# Patient Record
Sex: Male | Born: 1970
Health system: Southern US, Community
[De-identification: ages and names within clinical notes are randomized; demographics above are authoritative.]

## PROBLEM LIST (undated history)

## (undated) DIAGNOSIS — E119 Type 2 diabetes mellitus without complications: Secondary | ICD-10-CM

## (undated) HISTORY — PX: WRIST SURGERY: SHX841

## (undated) HISTORY — DX: Type 2 diabetes mellitus without complications: E11.9

---

## 2019-03-29 ENCOUNTER — Encounter: Payer: Self-pay | Admitting: Infectious Diseases

## 2019-04-26 ENCOUNTER — Other Ambulatory Visit (HOSPITAL_COMMUNITY)
Admission: RE | Admit: 2019-04-26 | Discharge: 2019-04-26 | Disposition: A | Discharge status: Home / Self Care | Payer: 59 | Source: Ambulatory Visit | Attending: Infectious Diseases | Admitting: Infectious Diseases

## 2019-04-26 ENCOUNTER — Other Ambulatory Visit: Payer: Self-pay

## 2019-04-26 ENCOUNTER — Ambulatory Visit: Payer: 59

## 2019-04-26 ENCOUNTER — Other Ambulatory Visit: Payer: 59

## 2019-04-26 DIAGNOSIS — B20 Human immunodeficiency virus [HIV] disease: Secondary | ICD-10-CM | POA: Insufficient documentation

## 2019-04-26 LAB — URINALYSIS
Bilirubin Urine: NEGATIVE
Hgb urine dipstick: NEGATIVE
Leukocytes,Ua: NEGATIVE
Nitrite: NEGATIVE
Specific Gravity, Urine: 1.034 (ref 1.001–1.03)
pH: <=5 (ref 5.0–8.0)

## 2019-04-27 LAB — URINE CYTOLOGY ANCILLARY ONLY
Chlamydia: NEGATIVE
Comment: NEGATIVE
Comment: NORMAL
Neisseria Gonorrhea: NEGATIVE

## 2019-04-27 LAB — T-HELPER CELL (CD4) - (RCID CLINIC ONLY)
CD4 % Helper T Cell: 5 % — ABNORMAL LOW (ref 33–65)
CD4 T Cell Abs: <35 /uL — ABNORMAL LOW (ref 400–1790)

## 2019-05-06 LAB — COMPLETE METABOLIC PANEL WITH GFR
AG Ratio: 1.2 (calc) (ref 1.0–2.5)
ALT: 21 U/L (ref 9–46)
AST: 19 U/L (ref 10–40)
Albumin: 4 g/dL (ref 3.6–5.1)
Alkaline phosphatase (APISO): 99 U/L (ref 36–130)
BUN: 11 mg/dL (ref 7–25)
CO2: 30 mmol/L (ref 20–32)
Calcium: 9.2 mg/dL (ref 8.6–10.3)
Chloride: 95 mmol/L — ABNORMAL LOW (ref 98–110)
Creat: 0.98 mg/dL (ref 0.60–1.35)
GFR, Est African American: 105 mL/min/{1.73_m2} (ref >=60)
GFR, Est Non African American: 91 mL/min/{1.73_m2} (ref >=60)
Globulin: 3.4 g/dL (calc) (ref 1.9–3.7)
Glucose, Bld: 333 mg/dL — ABNORMAL HIGH (ref 65–99)
Potassium: 4.4 mmol/L (ref 3.5–5.3)
Sodium: 134 mmol/L — ABNORMAL LOW (ref 135–146)
Total Bilirubin: 0.7 mg/dL (ref 0.2–1.2)
Total Protein: 7.4 g/dL (ref 6.1–8.1)

## 2019-05-06 LAB — HIV-1 RNA ULTRAQUANT REFLEX TO GENTYP+
HIV 1 RNA Quant: 341000 copies/mL — ABNORMAL HIGH
HIV-1 RNA Quant, Log: 5.53 Log copies/mL — ABNORMAL HIGH

## 2019-05-06 LAB — CBC WITH DIFFERENTIAL/PLATELET
Absolute Monocytes: 601 cells/uL (ref 200–950)
Basophils Absolute: 20 cells/uL (ref 0–200)
Basophils Relative: 0.6 %
Eosinophils Absolute: 119 cells/uL (ref 15–500)
Eosinophils Relative: 3.6 %
HCT: 41.7 % (ref 38.5–50.0)
Hemoglobin: 14.5 g/dL (ref 13.2–17.1)
Lymphs Abs: 281 cells/uL — ABNORMAL LOW (ref 850–3900)
MCH: 31.4 pg (ref 27.0–33.0)
MCHC: 34.8 g/dL (ref 32.0–36.0)
MCV: 90.3 fL (ref 80.0–100.0)
MPV: 10.4 fL (ref 7.5–12.5)
Monocytes Relative: 18.2 %
Neutro Abs: 2280 cells/uL (ref 1500–7800)
Neutrophils Relative %: 69.1 %
Platelets: 207 10*3/uL (ref 140–400)
RBC: 4.62 10*6/uL (ref 4.20–5.80)
RDW: 12 % (ref 11.0–15.0)
Total Lymphocyte: 8.5 %
WBC: 3.3 10*3/uL — ABNORMAL LOW (ref 3.8–10.8)

## 2019-05-06 LAB — HIV-1 GENOTYPE: HIV-1 Genotype: DETECTED — AB

## 2019-05-06 LAB — HEPATITIS C ANTIBODY
Hepatitis C Ab: NONREACTIVE
SIGNAL TO CUT-OFF: 0.04 (ref <1.00)

## 2019-05-06 LAB — HEPATITIS B CORE ANTIBODY, TOTAL: Hep B Core Total Ab: NONREACTIVE

## 2019-05-06 LAB — LIPID PANEL
Cholesterol: 183 mg/dL (ref <200)
HDL: 30 mg/dL — ABNORMAL LOW (ref >=40)
LDL Cholesterol (Calc): 128 mg/dL (calc) — ABNORMAL HIGH
Non-HDL Cholesterol (Calc): 153 mg/dL (calc) — ABNORMAL HIGH (ref <130)
Total CHOL/HDL Ratio: 6.1 (calc) — ABNORMAL HIGH (ref <5.0)
Triglycerides: 140 mg/dL (ref <150)

## 2019-05-06 LAB — QUANTIFERON-TB GOLD PLUS
Mitogen-NIL: 1.02 IU/mL
NIL: 0.04 IU/mL
QuantiFERON-TB Gold Plus: NEGATIVE
TB1-NIL: 0 IU/mL
TB2-NIL: 0 IU/mL

## 2019-05-06 LAB — HIV-1/2 AB - DIFFERENTIATION
HIV-1 antibody: POSITIVE — AB
HIV-2 Ab: NEGATIVE

## 2019-05-06 LAB — HIV ANTIBODY (ROUTINE TESTING W REFLEX): HIV 1&2 Ab, 4th Generation: REACTIVE — AB

## 2019-05-06 LAB — HLA B*5701: HLA-B*5701 w/rflx HLA-B High: NEGATIVE

## 2019-05-06 LAB — HEPATITIS B SURFACE ANTIBODY,QUALITATIVE: Hep B S Ab: NONREACTIVE

## 2019-05-06 LAB — HEPATITIS B SURFACE ANTIGEN: Hepatitis B Surface Ag: NONREACTIVE

## 2019-05-06 LAB — RPR: RPR Ser Ql: NONREACTIVE

## 2019-05-06 LAB — HEPATITIS A ANTIBODY, TOTAL: Hepatitis A AB,Total: REACTIVE — AB

## 2019-05-16 ENCOUNTER — Ambulatory Visit (INDEPENDENT_AMBULATORY_CARE_PROVIDER_SITE_OTHER): Payer: 59 | Admitting: Infectious Diseases

## 2019-05-16 ENCOUNTER — Telehealth: Payer: Self-pay | Admitting: Pharmacy Technician

## 2019-05-16 ENCOUNTER — Other Ambulatory Visit: Payer: Self-pay

## 2019-05-16 ENCOUNTER — Encounter: Payer: Self-pay | Admitting: Infectious Diseases

## 2019-05-16 ENCOUNTER — Ambulatory Visit (INDEPENDENT_AMBULATORY_CARE_PROVIDER_SITE_OTHER): Payer: 59 | Admitting: Pharmacist

## 2019-05-16 VITALS — BP 118/83 | HR 87 | Temp 98.9°F | Ht 62.0 in | Wt 114.0 lb

## 2019-05-16 DIAGNOSIS — Z23 Encounter for immunization: Secondary | ICD-10-CM | POA: Diagnosis not present

## 2019-05-16 DIAGNOSIS — B20 Human immunodeficiency virus [HIV] disease: Secondary | ICD-10-CM | POA: Diagnosis not present

## 2019-05-16 DIAGNOSIS — Z21 Asymptomatic human immunodeficiency virus [HIV] infection status: Secondary | ICD-10-CM | POA: Insufficient documentation

## 2019-05-16 DIAGNOSIS — E119 Type 2 diabetes mellitus without complications: Secondary | ICD-10-CM | POA: Insufficient documentation

## 2019-05-16 HISTORY — DX: Human immunodeficiency virus (HIV) disease: B20

## 2019-05-16 MED ORDER — ONDANSETRON 4 MG PO TBDP: 4.0000 mg | ORAL_TABLET | Freq: Three times a day (TID) | ORAL | 0 refills | Status: DC | PRN

## 2019-05-16 MED ORDER — BICTEGRAVIR-EMTRICITAB-TENOFOV 50-200-25 MG PO TABS: 1.0000 | ORAL_TABLET | Freq: Every day | ORAL | 5 refills | Status: DC

## 2019-05-16 MED ORDER — FLUCONAZOLE 200 MG PO TABS: 200.0000 mg | ORAL_TABLET | Freq: Every day | ORAL | 0 refills | Status: DC

## 2019-05-16 MED ORDER — SULFAMETHOXAZOLE-TRIMETHOPRIM 200-40 MG/5ML PO SUSP: 10.0000 mL | Freq: Every day | ORAL | 2 refills | Status: DC

## 2019-05-16 MED FILL — ONDANSETRON ODT 4 MG TABLET: 4 | 6 days supply | Qty: 20 | Fill #0

## 2019-05-16 MED FILL — SULFAMETHOXAZOLE-TMP SUSP: 200-40 | 20 days supply | Qty: 200 | Fill #0

## 2019-05-16 MED FILL — BIKTARVY 50-200-25 MG TABS: 50-200-25 | 30 days supply | Qty: 30 | Fill #0

## 2019-05-16 MED FILL — FLUCONAZOLE 200 MG TAB: 200 | 21 days supply | Qty: 21 | Fill #0

## 2019-05-16 NOTE — Patient Instructions (Addendum)
If you cannot swallow your pills - please call to let us know so we can try to get you some different mouth rinse for the pain.   New Medications:  [name] is the pill I would like for you to start taking to treat you - this will need to be taken once a day around the same time.  - Common side effects for a short time frame usually include headaches, nausea and diarrhea - OK to take over the counter tylenol for headaches and imodium for diarrhea - Try taking with food if you are nauseated  - If you take any multivitamins or supplements please separate them from your Biktarvy by 6 hours before and after.  The main thing is do not have them in the stomach at the same time.  Fluconazole - small pink pill once a day for your mouth. Please continue this for 21 days.   Bactrim (liquid) - please take 10 mLs once a day - this will protect you while your condition improves.   Zofran dissolvable tablet - place one under the tongue to dissolve if you have trouble with nausea or vomiting with your medications. Take 30 minutes before your Biktarvy.   Metformin - will need to decrease your dose to ONE 500 mg tablet twice a day.    Please come back in 1 month with Judeth Cornfield to check in

## 2019-05-16 NOTE — Telephone Encounter (Signed)
[facility] Patient Advocate Encounter    Findings of the benefits investigation:   Insurance: Manufacturing engineer) Test run [employer]) Estimated copay amount: $899.02 Prior Authorization: not required  It does not appear to be deductible but rather a 20% coverage medication. Will need [employer]ile[employer] copay card and later in the year PAF to cover.  Beulah Gandy, CPhT Specialty Pharmacy Patient [facility] for Infectious Disease Phone: [phone] Fax: [phone] 05/16/2019 9:37 AM

## 2019-05-16 NOTE — Progress Notes (Signed)
HPI: Robert Rosario is a 49 y.o. male who presents to the [facility] clinic today to initiate care with NP Dixon for his newly diagnosed HIV infection.  Patient Active Problem List   Diagnosis Date Noted  . Type 2 diabetes mellitus without complications ([place]) 98/92/1194  . HIV (human immunodeficiency virus infection) ([place]) 05/16/2019  . AIDS (acquired immune deficiency syndrome) ([place]) 05/16/2019    Patient's Medications  New Prescriptions   No medications on file  Previous Medications   ASPIRIN 81 MG CHEWABLE TABLET    Chew by mouth daily.   BICTEGRAVIR-EMTRICITABINE-TENOFOVIR AF (BIKTARVY) 50-200-25 MG TABS TABLET    Take 1 tablet by mouth daily. Try to take at the same time each day with or without food.   FLUCONAZOLE (DIFLUCAN) 200 MG TABLET    Take 1 tablet (200 mg total) by mouth daily.   GLIPIZIDE (GLUCOTROL XL) 10 MG 24 HR TABLET    Take 10 mg by mouth daily.   METFORMIN (GLUCOPHAGE-XR) 500 MG 24 HR TABLET    Take 500 mg by mouth 2 (two) times daily.   ONDANSETRON (ZOFRAN ODT) 4 MG DISINTEGRATING TABLET    Take 1 tablet (4 mg total) by mouth every 8 (eight) hours as needed for nausea or vomiting.   SULFAMETHOXAZOLE-TRIMETHOPRIM (BACTRIM) 200-40 MG/5ML SUSPENSION    Take 10 mLs by mouth daily.  Modified Medications   No medications on file  Discontinued Medications   No medications on file    Allergies: No Known Allergies  Past Medical History: Past Medical History:  Diagnosis Date  . Diabetes mellitus without complication [facility])     Social History: Social History   Socioeconomic History  . Marital status: Widowed    Spouse name: Not on file  . Number of children: Not on file  . Years of education: Not on file  . Highest education level: Not on file  Occupational History  . Not on file  Tobacco Use  . Smoking status: Never Smoker  . Smokeless tobacco: Never Used  Substance and Sexual Activity  . Alcohol use: Yes    Alcohol/week: 1.0 standard drinks    Types: 1  Glasses of wine per week  . Drug use: Never  . Sexual activity: Yes    Partners: Female  Other Topics Concern  . Not on file  Social History Narrative   Leaving with girlfriend   Drink 1 can beer a night.   Social Determinants of Health   Financial Resource Strain:   . Difficulty of Paying Living Expenses:   Food Insecurity:   . Worried About Charity fundraiser in the Last Year:   . Arboriculturist in the Last Year:   Transportation Needs:   . Film/video editor (Medical):   Marland Kitchen Lack of Transportation (Non-Medical):   Physical Activity:   . Days of Exercise per Week:   . Minutes of Exercise per Session:   Stress:   . Feeling of Stress :   Social Connections:   . Frequency of Communication with Friends and Family:   . Frequency of Social Gatherings with Friends and Family:   . Attends Religious Services:   . Active Member of Clubs or Organizations:   . Attends Archivist Meetings:   Marland Kitchen Marital Status:     Labs: Lab Results  Component Value Date   HIV1RNAQUANT 341,000 (H) 04/26/2019   CD4TABS <35 (L) 04/26/2019    RPR and STI Lab Results  Component Value Date  LABRPR NON-REACTIVE 04/26/2019    STI Results GC CT  04/26/2019 Negative Negative    Hepatitis B Lab Results  Component Value Date   HEPBSAB NON-REACTIVE 04/26/2019   HEPBSAG NON-REACTIVE 04/26/2019   HEPBCAB NON-REACTIVE 04/26/2019   Hepatitis C Lab Results  Component Value Date   HEPCAB NON-REACTIVE 04/26/2019   Hepatitis A Lab Results  Component Value Date   HAV REACTIVE (A) 04/26/2019   Lipids: Lab Results  Component Value Date   CHOL 183 04/26/2019   TRIG 140 04/26/2019   HDL 30 (L) 04/26/2019   CHOLHDL 6.1 (H) 04/26/2019   LDLCALC 128 (H) 04/26/2019    Current HIV Regimen: Treatment naive  Assessment: JP is here today to initiate care with [facility] for his newly diagnosed HIV infection. JP is treatment naive with an initial HIV viral load of 341,000 and a CD4  count of <35. Will start patient on Biktarvy.  Resistance to Efavirenz & Nevirapine. (K103R, V179D)  Patient was counseled on administration/ADE of Biktarvy. Emphasis was placed on him taking the medication everyday.   Plan: 1) Start Biktarvy 2) Start Fluconazole for 21 days for severe oral thrush 3) Start liquid Bactrim for PJP until CD4 count >200 for at least 3 consecutive months. 4) F/U 1 month with Atha Starks  Student Pharmacist, Class of [phone] for Infectious Disease 05/16/2019, 10:03 AM

## 2019-05-16 NOTE — Assessment & Plan Note (Signed)
Continue taking Glipizide 10mg  tablet daily. Decreased Metformin to one 500mg  tablet twice a day and will monitor for signs increase Metformin concentrations (lactic acidosis) while on Biktarvy.

## 2019-05-16 NOTE — Telephone Encounter (Signed)
[facility] Patient Advocate Encounter   Was successful in obtaining a [employer]ile[employer] copay card. This copay card will make the patients copay $0.  The billing information is RxBin: [id number] PCN: [id number] Member ID: [id number] Group ID: [id number]    Beulah Gandy, CPhT Specialty Pharmacy Patient [facility] for Infectious Disease Phone: [phone] Fax: [phone] 05/16/2019 9:43 AM

## 2019-05-16 NOTE — Progress Notes (Addendum)
Subjective:    Patient ID: Robert Rosario is a 49 y.o. male.  Chief Complaint: Robert Rosario is a new patient being referred by his PCP for a positive HIV-1 test on 1/27/2, this was his first time being tested for HIV. He is entering care at stage 3 HIV with AIDS defining symptoms. Viral load on 04/26/19 was 341,000 copies and CD4 nadir <35. Patient reports having recurrent oral thrush since December, his PCP started him on nystatin switch and oral Fluconazole. He has not been able to take his oral medications or eat. He has been taking Boost and Pedialyte from time to time to help with nutrition. He has lost significant amount of weight (>50lbs) over the past several months. He does not remember any viral syndrome symptoms( fevers, body aches, fatigue, diarrhea,rash). He declines feeling down or depressed. He is a former tobacco smoker. He drinks 1 beer after work everyday. He denies illicit drug use. He denies being currently sexually active.  Risk factors: Heterosexual contact [id number] negative Reactive for Hep A. Hep B and Hep C negative  Data Review:   Review of Systems  Constitutional: Positive for appetite change and unexpected weight change (loss more than 50lbs over past several months). Negative for activity change, chills, diaphoresis, fatigue and fever.  HENT: Positive for mouth sores and trouble swallowing.   Eyes: Negative.   Respiratory: Negative for cough, shortness of breath and wheezing.   Cardiovascular: Negative for chest pain, palpitations and leg swelling.  Gastrointestinal: Positive for vomiting (after he eats or take his medications). Negative for diarrhea and nausea.  Endocrine: Negative for cold intolerance and heat intolerance.  Skin: Negative for rash.  Neurological: Negative for dizziness, light-headedness, numbness and headaches.  Psychiatric/Behavioral: Negative for sleep disturbance. The patient is not nervous/anxious.       Objective:  Physical Exam Vitals  reviewed.  Constitutional:      Appearance: He is ill-appearing.  HENT:     Head: Normocephalic.     Nose: Nose normal.     Mouth/Throat:     Comments: Diffuse white patches in gum area, cheeks, tongue and throat. Eyes:     Extraocular Movements: Extraocular movements intact.     Pupils: Pupils are equal, round, and reactive to light.  Cardiovascular:     Rate and Rhythm: Normal rate and regular rhythm.     Pulses: Normal pulses.     Heart sounds: Normal heart sounds.  Pulmonary:     Effort: Pulmonary effort is normal.     Breath sounds: Normal breath sounds.  Abdominal:     General: Abdomen is flat. Bowel sounds are normal.  Musculoskeletal:        General: Normal range of motion.     Cervical back: Normal range of motion.  Skin:    General: Skin is warm and dry.  Neurological:     Mental Status: He is alert and oriented to person, place, and time.  Psychiatric:        Mood and Affect: Mood normal.        Behavior: Behavior normal.        Thought Content: Thought content normal.        Judgment: Judgment normal.    Today's Vitals   05/16/19 0913  BP: 118/83  Pulse: 87  Temp: 98.9 F (37.2 C)  SpO2: 99%  Weight: 114 lb (51.7 kg)  Height: 5' 2"  (1.575 m)   Body mass index is 20.85 kg/m.   Lab Results  Component  Value Date   HIV1RNAQUANT 341,000 (H) 04/26/2019    Assessment:   Problem List Items Addressed This Visit      Endocrine   Type 2 diabetes mellitus without complications ([place])    Continue taking Glipizide 83m tablet daily. Decreased Metformin to one 5078mtablet twice a day and will monitor for signs increase Metformin concentrations (lactic acidosis) while on Biktarvy.       Relevant Medications   glipiZIDE (GLUCOTROL XL) 10 MG 24 hr tablet   metFORMIN (GLUCOPHAGE-XR) 500 MG 24 hr tablet   aspirin 81 MG chewable tablet     Other   HIV (human immunodeficiency virus infection) ([place]   Discussed pathophysiology, risk factors, diagnosis and  treatment of HIV. Start taking Biktarvy one tablet daily. Discussed resources available at the clinic including mental health counseling. Met with pharmacist and pharmacy tech. Return to clinic in 1 month for follow up and lab work(HIV viral load and CD4 count).       Relevant Medications   fluconazole (DIFLUCAN) 200 MG tablet   sulfamethoxazole-trimethoprim (BACTRIM) 200-40 MG/5ML suspension   bictegravir-emtricitabine-tenofovir AF (BIKTARVY) 50-200-25 MG TABS tablet   AIDS (acquired immune deficiency syndrome) ([place]   Patient with oral candidiasis with possible esophageal involvement. Start Biktarvy one tablet by mouth daily. Start taking Fluconazole 20053mablet once a day for 21 days. CD4 <35, will have patient start taking Bactrim suspension 76m63maily. Agreed to receiving flu vaccine in clinic today. Return to clinic in 1 month for follow up.       Relevant Medications   fluconazole (DIFLUCAN) 200 MG tablet   sulfamethoxazole-trimethoprim (BACTRIM) 200-40 MG/5ML suspension   bictegravir-emtricitabine-tenofovir AF (BIKTARVY) 50-200-25 MG TABS tablet    Other Visit Diagnoses    Need for immunization against influenza    -  Primary   Relevant Orders   Flu Vaccine QUAD 36+ mos IM (Completed)        Plan:

## 2019-05-16 NOTE — Assessment & Plan Note (Signed)
Patient with oral candidiasis with possible esophageal involvement. Start Biktarvy one tablet by mouth daily. Start taking Fluconazole 200mg  tablet once a day for 21 days. CD4 <35, will have patient start taking Bactrim suspension daily. Agreed to receiving flu vaccine in clinic today. Return to clinic in 1 month for follow up.

## 2019-05-16 NOTE — Assessment & Plan Note (Addendum)
Discussed pathophysiology, risk factors, diagnosis and treatment of HIV. Start taking Biktarvy one tablet daily. Discussed refraining from sexual activities until being undetectable and being on ART for at least 6 months. Discussed resources available at the clinic including mental health counseling. Met with pharmacist and pharmacy tech. Return to clinic in 1 month for follow up and lab work(HIV viral load and CD4 count).

## 2019-06-06 ENCOUNTER — Encounter: Payer: Self-pay | Admitting: Infectious Diseases

## 2019-06-16 ENCOUNTER — Ambulatory Visit (INDEPENDENT_AMBULATORY_CARE_PROVIDER_SITE_OTHER): Payer: 59 | Admitting: Infectious Diseases

## 2019-06-16 ENCOUNTER — Ambulatory Visit
Admission: RE | Admit: 2019-06-16 | Discharge: 2019-06-16 | Disposition: A | Discharge status: Home / Self Care | Payer: 59 | Source: Ambulatory Visit | Attending: Infectious Diseases | Admitting: Infectious Diseases

## 2019-06-16 ENCOUNTER — Encounter: Payer: Self-pay | Admitting: Infectious Diseases

## 2019-06-16 ENCOUNTER — Other Ambulatory Visit: Payer: Self-pay

## 2019-06-16 VITALS — BP 137/97 | HR 82 | Temp 97.9°F | Ht 61.0 in | Wt 126.0 lb

## 2019-06-16 DIAGNOSIS — R21 Rash and other nonspecific skin eruption: Secondary | ICD-10-CM | POA: Diagnosis not present

## 2019-06-16 DIAGNOSIS — R079 Chest pain, unspecified: Secondary | ICD-10-CM

## 2019-06-16 DIAGNOSIS — B20 Human immunodeficiency virus [HIV] disease: Secondary | ICD-10-CM | POA: Diagnosis not present

## 2019-06-16 MED ORDER — TRIAMCINOLONE ACETONIDE 0.5 % EX OINT: 1.0000 "application " | TOPICAL_OINTMENT | Freq: Two times a day (BID) | CUTANEOUS | 1 refills | Status: DC

## 2019-06-16 MED ORDER — HYDRALAZINE HCL 25 MG PO TABS: 25.0000 mg | ORAL_TABLET | Freq: Three times a day (TID) | ORAL | 1 refills | Status: AC

## 2019-06-16 MED FILL — SULFAMETHOXAZOLE-TMP SUSP: 200-40 | 20 days supply | Qty: 200 | Fill #1

## 2019-06-16 MED FILL — BIKTARVY 50-200-25 MG TABS: 50-200-25 | 30 days supply | Qty: 30 | Fill #1

## 2019-06-16 NOTE — Patient Instructions (Addendum)
Nice to see you again today.    I think the chest pain you are experiencing may be due or worsened by anxiety. I don't see where this was a reported side effect to Biktarvy.   Please stop by the [facility] on your way out to do a chest x-ray   Warning signs to go to the ER for chest pain - if the pain does not resolve with rest, you expereince other symptoms like jaw pain, arm pain/numbness,

## 2019-06-16 NOTE — Progress Notes (Signed)
Name: Robert Rosario  DOB: 04-15-1970 MRN: [id number] PCP: Cyndi Bender, PA-C    Patient Active Problem List   Diagnosis Date Noted  . Chest pain 06/17/2019    Priority: High  . HIV (human immunodeficiency virus infection) ([place]) 05/16/2019    Priority: High  . AIDS (acquired immune deficiency syndrome) ([place]) 05/16/2019    Priority: High  . Rash of body 06/17/2019  . Type 2 diabetes mellitus without complications ([place]) 33/29/5188     Brief Narrative:  Robert Rosario is a 49 y.o. male with HIV, AIDS(+) at entry to care, Dx 04/2019.  CD4 nadir < 35 VL 341,000 HIV Risk: MSM History of OIs: esophageal candidiasis  Intake Labs 04/2019: Hep B sAg (-), sAb (-), cAb (-); Hep A (+), Hep C (-) Quantiferon (-) HLA B*5701 (-) G6PD: ()   Previous Regimens: . Biktarvy 05/2019 -->   Genotypes: . 04/2019 - K103R, V179D   Subjective:   Chief Complaint  Patient presents with  . Follow-up    B20--pt stated --having chest pain--right arm/face-numbness since started the [place].     HPI: Here for 4 week follow up. Has noticed that he has regained weight of nearly 20 lbs. Swallowing is no longer painful and he has no lingering evidence of thrush in the mouth. He has been taking his Biktarvy and Bactrim everyday without any missed doses. He has not had any diarrhea, nausea or headaches but he has had increased anxiety/worry, pruritic rashes to arms and some episodes of chest pain. He consulted with his brother, who has medical background in [place] and said his new medication may be causing this.   The episodes of chest pain occur at the anterior chest. Estimated that it has happened about 6 times in the last month. They occur at different moments throughout the day. Never with activity/exertion or at work. They have lasted up to 2 hours sometimes. He usually takes an aspirin when they occur. He does not have any other associated symptoms typically with exception of 1 time where he had right  hand numbness and facial headaches that spontaneously resolved.  He quit cigarettes 8 years ago but has very rare occasional cigarettes with no use in the last 3 months. He does describe that several episodes happened during what sounds to be more stressful moments (mom in hospital, during a thunderstorm, etc). No pain with eating foods. He does have some thoracic back pain that he feels on occasion that does not seem related to chest pain. Notices it more with movement especially at work with pushing/pulling.   New rash over the arms and chest. They are described to be very itchy small red pimple-like bumps that are on chest, arms and thighs/legs. No other new medications. He has not tried any remedies for this.   Depression screen [facility] 2/9 05/16/2019  Decreased Interest 0  Down, Depressed, Hopeless 0  PHQ - 2 Score 0    Review of Systems  Constitutional: Negative for chills, fever, malaise/fatigue and weight loss.  HENT: Negative for sore throat.        No dental problems  Respiratory: Negative for cough and sputum production.   Cardiovascular: Positive for chest pain. Negative for leg swelling.  Gastrointestinal: Negative for abdominal pain, diarrhea and vomiting.  Genitourinary: Negative for dysuria and flank pain.  Musculoskeletal: Positive for back pain. Negative for joint pain, myalgias and neck pain.  Skin: Positive for itching and rash.  Neurological: Negative for dizziness, tingling and headaches.  Psychiatric/Behavioral:  Negative for depression and substance abuse. The patient is nervous/anxious. The patient does not have insomnia.     Past Medical History:  Diagnosis Date  . Diabetes mellitus without complication [facility])     Outpatient Medications Prior to Visit  Medication Sig Dispense Refill  . aspirin 81 MG chewable tablet Chew by mouth daily.    . bictegravir-emtricitabine-tenofovir AF (BIKTARVY) 50-200-25 MG TABS tablet Take 1 tablet by mouth daily. Try to take at the  same time each day with or without food. 30 tablet 5  . fluconazole (DIFLUCAN) 200 MG tablet Take 1 tablet (200 mg total) by mouth daily. 21 tablet 0  . glipiZIDE (GLUCOTROL XL) 10 MG 24 hr tablet Take 10 mg by mouth daily.    . metFORMIN (GLUCOPHAGE-XR) 500 MG 24 hr tablet Take 500 mg by mouth 2 (two) times daily.    . ondansetron (ZOFRAN ODT) 4 MG disintegrating tablet Take 1 tablet (4 mg total) by mouth every 8 (eight) hours as needed for nausea or vomiting. 20 tablet 0  . sulfamethoxazole-trimethoprim (BACTRIM) 200-40 MG/5ML suspension Take 10 mLs by mouth daily. 200 mL 2   No facility-administered medications prior to visit.     No Known Allergies  Social History   Tobacco Use  . Smoking status: Never Smoker  . Smokeless tobacco: Never Used  Substance Use Topics  . Alcohol use: Yes    Alcohol/week: 1.0 standard drinks    Types: 1 Glasses of wine per week  . Drug use: Never    Family History  Problem Relation Age of Onset  . Diabetes Mother     Social History   Substance and Sexual Activity  Sexual Activity Yes  . Partners: Female     Objective:   Vitals:   06/16/19 0942  BP: (!) 137/97  Pulse: 82  Temp: 97.9 F (36.6 C)  Weight: 126 lb (57.2 kg)  Height: _0  (1.549 m)   Body mass index is 23.81 kg/m.  Physical Exam Vitals reviewed.  HENT:     Mouth/Throat:     Mouth: Mucous membranes are moist. No oral lesions.     Dentition: Normal dentition. No dental caries.     Pharynx: Oropharynx is clear.  Eyes:     General: No scleral icterus. Cardiovascular:     Rate and Rhythm: Normal rate and regular rhythm.     Pulses: Normal pulses.     Heart sounds: Normal heart sounds. No murmur.  Pulmonary:     Effort: Pulmonary effort is normal. No respiratory distress.     Breath sounds: Normal breath sounds.  Abdominal:     General: There is no distension.     Palpations: Abdomen is soft.     Tenderness: There is no abdominal tenderness.  Musculoskeletal:      Thoracic back: No swelling, edema, tenderness or bony tenderness. Normal range of motion.     Right lower leg: No edema.     Left lower leg: No edema.  Lymphadenopathy:     Cervical: No cervical adenopathy.  Skin:    General: Skin is warm and dry.     Capillary Refill: Capillary refill takes less than 2 seconds.     Findings: Rash (diffusely scattered papules overlying arms that are scabbed d/t scratching) present.  Neurological:     Mental Status: He is alert and oriented to person, place, and time.  Psychiatric:        Mood and Affect: Mood normal.  Lab Results Lab Results  Component Value Date   WBC 3.3 (L) 04/26/2019   HGB 14.5 04/26/2019   HCT 41.7 04/26/2019   MCV 90.3 04/26/2019   PLT 207 04/26/2019    Lab Results  Component Value Date   CREATININE 0.82 06/16/2019   BUN 13 06/16/2019   NA 138 06/16/2019   K 4.1 06/16/2019   CL 102 06/16/2019   CO2 27 06/16/2019    Lab Results  Component Value Date   ALT 21 06/16/2019   AST 24 06/16/2019   BILITOT 0.5 06/16/2019    Lab Results  Component Value Date   CHOL 183 04/26/2019   HDL 30 (L) 04/26/2019   LDLCALC 128 (H) 04/26/2019   TRIG 140 04/26/2019   CHOLHDL 6.1 (H) 04/26/2019   HIV 1 RNA Quant (copies/mL)  Date Value  04/26/2019 341,000 (H)   CD4 T Cell Abs (/uL)  Date Value  04/26/2019 <35 (L)     Assessment & Plan:   Problem List Items Addressed This Visit      High   HIV (human immunodeficiency virus infection) ([place]) (Chronic)    He seems to be tolerating the [place] well and taking it correctly. I do not think his anxiety or chest pains are due to the [place], but if he perceives this to be the case can consider a switch in medication. He would like to continue on Biktarvy another month since we have identified other possible options for chest pain reasons.  Check VL and CD4 today.  RTC 1 month      Chest pain    His description does not fit with typical ischemic chest pain - EKG  done today and reviewed to be NSR with PR 164 ms, QRS 88 ms, QT/QTc 386/428 ms making this less concerning for cardiac origin. CXR obtained today with no cardiopulmonary findings and normal cardiac size/borders. He has no abnormal heart sounds on exam. Non-smoker, BP well controlled. With the exception of 1 episode where he describes headaches/tingling and right arm numbness they have correlated with stressful moments for him. ED precautions discussed that would warrant evaluation.  Other consideration may be GERD - advised to OTC relief with pepcid vs hydralazine to see if this helps symptoms. Will continue to monitor for events.  Doubt related to [place].       Relevant Orders   DG Chest 2 View (Completed)   AIDS (acquired immune deficiency syndrome) ([place]) - Primary    Candidiasis has resolved with 3 weeks fluconazole and he has regained weight since starting ART. He is feeling much better. No findings concerning for other OIs today.  Rash may be due to the Bactrim - will reduce to 3x a week to see if this helps. May need to switch to Dapsone.       Relevant Orders   HIV-1 RNA quant-no reflex-bld   T-helper cell (CD4)- ([facility] clinic only)   COMPLETE METABOLIC PANEL WITH GFR (Completed)     Unprioritized   Rash of body    Most prominent on arms papular rash. ?immune reconstitution in the setting of AIDS with CD4 < 35 vs Drug reaction (likely Bactrim and not so much the [place]). Will follow CD4 and treat with antihistamines + topical steroid and follow in 75m Advised to call should rash get more severe.          SJanene Madeira MSN, NP-C RBlack River Community Medical Centerfor Infectious DNorth BraddockPager: [phone]: [phone] 06/17/19  8:57 AM

## 2019-06-17 DIAGNOSIS — R079 Chest pain, unspecified: Secondary | ICD-10-CM | POA: Insufficient documentation

## 2019-06-17 DIAGNOSIS — R21 Rash and other nonspecific skin eruption: Secondary | ICD-10-CM | POA: Insufficient documentation

## 2019-06-17 LAB — T-HELPER CELL (CD4) - (RCID CLINIC ONLY)
CD4 % Helper T Cell: 11 % — ABNORMAL LOW (ref 33–65)
CD4 T Cell Abs: 84 /uL — ABNORMAL LOW (ref 400–1790)

## 2019-06-17 NOTE — Assessment & Plan Note (Signed)
He seems to be tolerating the [place] well and taking it correctly. I do not think his anxiety or chest pains are due to the [place], but if he perceives this to be the case can consider a switch in medication. He would like to continue on Biktarvy another month since we have identified other possible options for chest pain reasons.  Check VL and CD4 today.  RTC 1 month

## 2019-06-17 NOTE — Assessment & Plan Note (Signed)
His description does not fit with typical ischemic chest pain - EKG done today and reviewed to be NSR with PR 164 ms, QRS 88 ms, QT/QTc 386/428 ms making this less concerning for cardiac origin. CXR obtained today with no cardiopulmonary findings and normal cardiac size/borders. He has no abnormal heart sounds on exam. Non-smoker, BP well controlled. With the exception of 1 episode where he describes headaches/tingling and right arm numbness they have correlated with stressful moments for him. ED precautions discussed that would warrant evaluation.  Other consideration may be GERD - advised to OTC relief with pepcid vs hydralazine to see if this helps symptoms. Will continue to monitor for events.  Doubt related to Biktarvy.

## 2019-06-17 NOTE — Assessment & Plan Note (Signed)
Most prominent on arms papular rash. ?immune reconstitution in the setting of AIDS with CD4 < 35 vs Drug reaction (likely Bactrim and not so much the [place]). Will follow CD4 and treat with antihistamines + topical steroid and follow in 56m. Advised to call should rash get more severe.

## 2019-06-17 NOTE — Assessment & Plan Note (Signed)
Candidiasis has resolved with 3 weeks fluconazole and he has regained weight since starting ART. He is feeling much better. No findings concerning for other OIs today.  Rash may be due to the Bactrim - will reduce to 3x a week to see if this helps. May need to switch to Dapsone.

## 2019-06-19 LAB — COMPLETE METABOLIC PANEL WITH GFR
AG Ratio: 1.2 (calc) (ref 1.0–2.5)
ALT: 21 U/L (ref 9–46)
AST: 24 U/L (ref 10–40)
Albumin: 3.9 g/dL (ref 3.6–5.1)
Alkaline phosphatase (APISO): 94 U/L (ref 36–130)
BUN: 13 mg/dL (ref 7–25)
CO2: 27 mmol/L (ref 20–32)
Calcium: 9.5 mg/dL (ref 8.6–10.3)
Chloride: 102 mmol/L (ref 98–110)
Creat: 0.82 mg/dL (ref 0.60–1.35)
GFR, Est African American: 121 mL/min/{1.73_m2} (ref >=60)
GFR, Est Non African American: 105 mL/min/{1.73_m2} (ref >=60)
Globulin: 3.2 g/dL (calc) (ref 1.9–3.7)
Glucose, Bld: 160 mg/dL — ABNORMAL HIGH (ref 65–99)
Potassium: 4.1 mmol/L (ref 3.5–5.3)
Sodium: 138 mmol/L (ref 135–146)
Total Bilirubin: 0.5 mg/dL (ref 0.2–1.2)
Total Protein: 7.1 g/dL (ref 6.1–8.1)

## 2019-06-19 LAB — HIV-1 RNA QUANT-NO REFLEX-BLD
HIV 1 RNA Quant: 271 copies/mL — ABNORMAL HIGH
HIV-1 RNA Quant, Log: 2.43 Log copies/mL — ABNORMAL HIGH

## 2019-06-20 ENCOUNTER — Telehealth: Payer: Self-pay

## 2019-06-20 MED ORDER — SULFAMETHOXAZOLE-TRIMETHOPRIM 400-80 MG PO TABS: 1.0000 | ORAL_TABLET | Freq: Every day | ORAL | 3 refills | Status: DC

## 2019-06-20 NOTE — Addendum Note (Signed)
Addended by: Blanchard Kelch on: 06/20/2019 11:08 AM   Modules accepted: Orders

## 2019-06-20 NOTE — Telephone Encounter (Signed)
Thank you - I sent him a result note via MyChart also so hopeful he will see that while we try to get him.

## 2019-06-20 NOTE — Progress Notes (Signed)
Please call Robert Rosario to see how he is feeling - was having some chest pain at our recent visit.  His CD4 count is showing signs of recovery but still low and we need to continue his Bactrim. I am going to reduce it to a single strength dose pill to see if it helps his rash. This was previously being given as a liquid for him. Orders have been entered for his pharmacy; he should continue filling this medication for 3 months.  Thank you!

## 2019-06-20 NOTE — Telephone Encounter (Signed)
Called patient to follow up from appointment with Judeth Cornfield, NP. Patient's VM not set up and unable to leave VM. Will try again later.   Marian Grandt Loyola Mast, RN

## 2019-06-20 NOTE — Telephone Encounter (Signed)
-----   Message from Blanchard Kelch, NP sent at 06/20/2019 11:08 AM EDT ----- Please call Robert Rosario to see how he is feeling - was having some chest pain at our recent visit.  His CD4 count is showing signs of recovery but still low and we need to continue his Bactrim. I am going to reduce it to a single strength dose pill to see if it helps his rash. This was previously being given as a liquid for him. Orders have been entered for his pharmacy; he should continue filling this medication for 3 months.  Thank you!

## 2019-07-07 ENCOUNTER — Encounter: Payer: Self-pay | Admitting: Infectious Diseases

## 2019-07-07 ENCOUNTER — Ambulatory Visit (INDEPENDENT_AMBULATORY_CARE_PROVIDER_SITE_OTHER): Payer: 59 | Admitting: Infectious Diseases

## 2019-07-07 ENCOUNTER — Other Ambulatory Visit: Payer: Self-pay

## 2019-07-07 VITALS — BP 121/73 | HR 93 | Temp 98.5°F | Wt 131.0 lb

## 2019-07-07 DIAGNOSIS — R079 Chest pain, unspecified: Secondary | ICD-10-CM

## 2019-07-07 DIAGNOSIS — G479 Sleep disorder, unspecified: Secondary | ICD-10-CM

## 2019-07-07 DIAGNOSIS — B20 Human immunodeficiency virus [HIV] disease: Secondary | ICD-10-CM

## 2019-07-07 DIAGNOSIS — R21 Rash and other nonspecific skin eruption: Secondary | ICD-10-CM | POA: Diagnosis not present

## 2019-07-07 MED ORDER — DAPSONE 100 MG PO TABS: 100.0000 mg | ORAL_TABLET | Freq: Every day | ORAL | 5 refills | Status: DC

## 2019-07-07 NOTE — Assessment & Plan Note (Signed)
Resolved - all previous work up reassuring this was not cardiac in nature.

## 2019-07-07 NOTE — Assessment & Plan Note (Signed)
He seems to be having a good clinical response to [employer]. Last VL ~220 copies. Will repeat today. Continue Biktarvy, follow up in 3 months and begin recommended vaccines at that visit.

## 2019-07-07 NOTE — Patient Instructions (Addendum)
Stop the Bactrim (white pill) - I think this is making you itchy.   Please start taking Dapsone (small white pill) once a day to protect your immune system.   Please continue your Biktarvy one pill everyday.   Please stop by the lab on your way out to repeat your viral load.     Recommendations for improving sleep:   Avoid having pets sleep in the bedroom  Avoid caffeine consumption after 4pm  Keep bedroom cool and conducive to sleep  Avoid nicotine use, especially in the evening  Avoid exercise within 2-3 hours before bedtime  Stimulus Control:   Go to bed only when sleepy  Use the bedroom for sleep and sex only  Go to another room if you are unable to fall asleep within 15 to 20 minutes  Read or engage in other quiet activities and return to bed only when sleepy.  Melatonin 5 mg may be something helpful for you to try to sleep. Get the chewables to help you get to sleep faster.    Follow up with me again in 3 months.

## 2019-07-07 NOTE — Assessment & Plan Note (Signed)
Will change him to Dapsone for OI prophylaxis to see if removing sulfa helps his rash.  Continue conservative measures for treatment. G6PD assessed today.

## 2019-07-07 NOTE — Assessment & Plan Note (Signed)
Continue Dapsone for OI prophylaxis. CD4 up to 84.  No findings on exam concerning for new process today.

## 2019-07-07 NOTE — Progress Notes (Signed)
Name: Robert Rosario  DOB: 27-Jan-1971 MRN: [id number] PCP: Cyndi Bender, PA-C    Patient Active Problem List   Diagnosis Date Noted  . Chest pain 06/17/2019    Priority: High  . HIV (human immunodeficiency virus infection) ([place]) 05/16/2019    Priority: High  . AIDS (acquired immune deficiency syndrome) ([place]) 05/16/2019    Priority: High  . Trouble in sleeping 07/07/2019  . Rash of body 06/17/2019  . Type 2 diabetes mellitus without complications ([place]) 37/16/9678     Brief Narrative:  Robert Rosario is a 49 y.o. male with HIV, AIDS(+) at entry to care, Dx 04/2019.  CD4 nadir < 35 VL 341,000 HIV Risk: MSM History of OIs: esophageal candidiasis  Intake Labs 04/2019: Hep B sAg (-), sAb (-), cAb (-); Hep A (+), Hep C (-) Quantiferon (-) HLA B*5701 (-) G6PD: ()   Previous Regimens: . Biktarvy 05/2019 -->   Genotypes: . 04/2019 - K103R, V179D   Subjective:   CC: HIV, AIDS+ follow up care    HPI: No further episodes of chest pain. He feels stronger and able to do more for himself. He has had a good appetite and gained back another 5 lbs which he is happy about.   He does have trouble sleeping with his work schedule - gets home around 3am and watches TV but does not fall asleep for a while. He has not tried any treatment for this.   Upper back muscle pain from pushing/pulling heavy objects at work. Not a new problem but intermittent and non-severe. No radiculopathy described. No neck pain.   Ongoing pruritic rash over the arms and chest. They are described to be very itchy small red pimple-like bumps that are on chest, arms and thighs/legs.     Depression screen [facility] 2/9 05/16/2019  Decreased Interest 0  Down, Depressed, Hopeless 0  PHQ - 2 Score 0    Review of Systems  Constitutional: Negative for chills, fever, malaise/fatigue and weight loss.  HENT: Negative for sore throat.        No dental problems  Respiratory: Negative for cough and sputum production.     Cardiovascular: Negative for chest pain and leg swelling.  Gastrointestinal: Negative for abdominal pain, diarrhea and vomiting.  Genitourinary: Negative for dysuria and flank pain.  Musculoskeletal: Positive for back pain. Negative for joint pain, myalgias and neck pain.  Skin: Negative for itching and rash.  Neurological: Negative for dizziness, tingling and headaches.  Psychiatric/Behavioral: Negative for depression and substance abuse. The patient is not nervous/anxious and does not have insomnia.     Past Medical History:  Diagnosis Date  . Diabetes mellitus without complication ([place])   . HIV (human immunodeficiency virus infection) ([place]) 05/16/2019    Outpatient Medications Prior to Visit  Medication Sig Dispense Refill  . aspirin 81 MG chewable tablet Chew by mouth daily.    . bictegravir-emtricitabine-tenofovir AF (BIKTARVY) 50-200-25 MG TABS tablet Take 1 tablet by mouth daily. Try to take at the same time each day with or without food. 30 tablet 5  . glipiZIDE (GLUCOTROL XL) 10 MG 24 hr tablet Take 10 mg by mouth daily.    . hydrALAZINE (APRESOLINE) 25 MG tablet Take 1 tablet (25 mg total) by mouth 3 (three) times daily. 60 tablet 1  . metFORMIN (GLUCOPHAGE-XR) 500 MG 24 hr tablet Take 500 mg by mouth 2 (two) times daily.    Marland Kitchen triamcinolone ointment (KENALOG) 0.5 % Apply 1 application topically 2 (two) times daily.  30 g 1  . fluconazole (DIFLUCAN) 200 MG tablet Take 1 tablet (200 mg total) by mouth daily. 21 tablet 0  . sulfamethoxazole-trimethoprim (BACTRIM) 400-80 MG tablet Take 1 tablet by mouth daily. 30 tablet 3   No facility-administered medications prior to visit.     No Known Allergies  Social History   Tobacco Use  . Smoking status: Never Smoker  . Smokeless tobacco: Never Used  Substance Use Topics  . Alcohol use: Yes    Alcohol/week: 1.0 standard drinks    Types: 1 Glasses of wine per week  . Drug use: Never    Family History  Problem Relation Age of  Onset  . Diabetes Mother     Social History   Substance and Sexual Activity  Sexual Activity Yes  . Partners: Female     Objective:   Vitals:   07/07/19 0951  BP: 121/73  Pulse: 93  Temp: 98.5 F (36.9 C)  TempSrc: Oral  Weight: 131 lb (59.4 kg)   Body mass index is 24.75 kg/m.  Physical Exam Vitals reviewed.  HENT:     Mouth/Throat:     Mouth: Mucous membranes are moist. No oral lesions.     Dentition: Normal dentition. No dental caries.     Pharynx: Oropharynx is clear.  Eyes:     General: No scleral icterus. Cardiovascular:     Rate and Rhythm: Normal rate and regular rhythm.     Pulses: Normal pulses.     Heart sounds: Normal heart sounds. No murmur.  Pulmonary:     Effort: Pulmonary effort is normal. No respiratory distress.     Breath sounds: Normal breath sounds.  Abdominal:     General: There is no distension.     Palpations: Abdomen is soft.     Tenderness: There is no abdominal tenderness.  Musculoskeletal:     Thoracic back: No swelling, edema, tenderness or bony tenderness. Normal range of motion.     Right lower leg: No edema.     Left lower leg: No edema.  Lymphadenopathy:     Cervical: No cervical adenopathy.  Skin:    General: Skin is warm and dry.     Capillary Refill: Capillary refill takes less than 2 seconds.     Findings: Rash (diffusely scattered papules overlying arms that are scabbed d/t scratching) present.  Neurological:     Mental Status: He is alert and oriented to person, place, and time.  Psychiatric:        Mood and Affect: Mood normal.     Lab Results Lab Results  Component Value Date   WBC 3.3 (L) 04/26/2019   HGB 14.5 04/26/2019   HCT 41.7 04/26/2019   MCV 90.3 04/26/2019   PLT 207 04/26/2019    Lab Results  Component Value Date   CREATININE 0.82 06/16/2019   BUN 13 06/16/2019   NA 138 06/16/2019   K 4.1 06/16/2019   CL 102 06/16/2019   CO2 27 06/16/2019    Lab Results  Component Value Date   ALT 21  06/16/2019   AST 24 06/16/2019   BILITOT 0.5 06/16/2019    Lab Results  Component Value Date   CHOL 183 04/26/2019   HDL 30 (L) 04/26/2019   LDLCALC 128 (H) 04/26/2019   TRIG 140 04/26/2019   CHOLHDL 6.1 (H) 04/26/2019   HIV 1 RNA Quant (copies/mL)  Date Value  06/16/2019 271 (H)  04/26/2019 341,000 (H)   CD4 T Cell Abs (/uL)  Date Value  06/16/2019 84 (L)  04/26/2019 <35 (L)     Assessment & Plan:   Problem List Items Addressed This Visit      High   HIV (human immunodeficiency virus infection) ([place]) - Primary (Chronic)    He seems to be having a good clinical response to [employer]. Last VL ~220 copies. Will repeat today. Continue Biktarvy, follow up in 3 months and begin recommended vaccines at that visit.       Relevant Medications   dapsone 100 MG tablet   Other Relevant Orders   Glucose 6 phosphate dehydrogenase   HIV-1 RNA quant-no reflex-bld   T-helper cell (CD4)- ([facility] clinic only)   AIDS (acquired immune deficiency syndrome) ([place])    Continue Dapsone for OI prophylaxis. CD4 up to 84.  No findings on exam concerning for new process today.       Relevant Medications   dapsone 100 MG tablet   Chest pain    Resolved - all previous work up reassuring this was not cardiac in nature.         Unprioritized   Rash of body    Will change him to Dapsone for OI prophylaxis to see if removing sulfa helps his rash.  Continue conservative measures for treatment. G6PD assessed today.       Trouble in sleeping    New problem. Sleep hygiene discussed. Trial of melatonin OTC. Suggested to decrease screen time when he comes home.          Janene Madeira, MSN, NP-C [facility] for Infectious [place] Pager: [phone] Office: [phone]  07/07/19  2:07 PM

## 2019-07-07 NOTE — Assessment & Plan Note (Addendum)
New problem. Sleep hygiene discussed. Trial of melatonin OTC. Suggested to decrease screen time when he comes home.

## 2019-07-08 LAB — T-HELPER CELL (CD4) - (RCID CLINIC ONLY)
CD4 % Helper T Cell: 13 % — ABNORMAL LOW (ref 33–65)
CD4 T Cell Abs: 113 /uL — ABNORMAL LOW (ref 400–1790)

## 2019-07-09 LAB — GLUCOSE 6 PHOSPHATE DEHYDROGENASE: G-6PDH: 14.9 U/g Hgb (ref 7.0–20.5)

## 2019-07-09 LAB — HIV-1 RNA QUANT-NO REFLEX-BLD
HIV 1 RNA Quant: 130 copies/mL — ABNORMAL HIGH
HIV-1 RNA Quant, Log: 2.11 Log copies/mL — ABNORMAL HIGH

## 2019-07-29 MED FILL — BIKTARVY 50-200-25 MG TABS: 50-200-25 | 30 days supply | Qty: 30 | Fill #2

## 2019-09-09 MED FILL — BIKTARVY 50-200-25 MG TABS: 50-200-25 | 30 days supply | Qty: 30 | Fill #3

## 2019-10-07 ENCOUNTER — Other Ambulatory Visit: Payer: Self-pay

## 2019-10-07 ENCOUNTER — Encounter: Payer: Self-pay | Admitting: Infectious Diseases

## 2019-10-07 ENCOUNTER — Telehealth (INDEPENDENT_AMBULATORY_CARE_PROVIDER_SITE_OTHER): Payer: 59 | Admitting: Infectious Diseases

## 2019-10-07 DIAGNOSIS — B20 Human immunodeficiency virus [HIV] disease: Secondary | ICD-10-CM

## 2019-10-07 NOTE — Progress Notes (Signed)
Name: Robert Rosario  DOB: August 05, 1970 MRN: [id number] PCP: Cyndi Bender, PA-C   VIRTUAL CARE ENCOUNTER  I connected with Shelly Flatten on 10/07/19 at  9:45 AM EDT by TELEPHONE and verified that I am speaking with the correct person using two identifiers.   I discussed the limitations, risks, security and privacy concerns of performing an evaluation and management service by telephone and the availability of in person appointments. I also discussed with the patient that there may be a patient responsible charge related to this service. The patient expressed understanding and agreed to proceed.  Patient Location: [facility]   Other Participants:   Provider Location: [facility] Office    Patient Active Problem List   Diagnosis Date Noted  . Chest pain 06/17/2019    Priority: High  . HIV (human immunodeficiency virus infection) ([place]) 05/16/2019    Priority: High  . AIDS (acquired immune deficiency syndrome) ([place]) 05/16/2019    Priority: High  . Trouble in sleeping 07/07/2019  . Rash of body 06/17/2019  . Type 2 diabetes mellitus without complications ([place]) 49/03/7791     Brief Narrative:  [name] is a 49 y.o. male with HIV, AIDS(+) at entry to care, Dx 04/2019.  CD4 nadir < 35 VL 341,000 HIV Risk: MSM History of OIs: esophageal candidiasis  Intake Labs 04/2019: Hep B sAg (-), sAb (-), cAb (-); Hep A (+), Hep C (-) Quantiferon (-) HLA B*5701 (-) G6PD: ()   Previous Regimens: . Biktarvy 05/2019 -->   Genotypes: . 04/2019 - K103R, V179D   Subjective:   CC: HIV, AIDS+ follow up care He has a cough and runny nose     HPI: He is feeling well aside from runny nose and cough that started this week. He thinks it is more the weather changes causing that. No fevers or chills. No diarrhea or GI symptoms.   He is taking his Biktarvy every day without missed doses. No concern for access to his medication.   Rash has improved since stopping the bactrim. Not gone completely  but no longer itches.    Review of Systems  Constitutional: Negative for chills and fever.  HENT: Positive for congestion. Negative for tinnitus.   Eyes: Negative for blurred vision and photophobia.  Respiratory: Positive for cough. Negative for sputum production.   Cardiovascular: Negative for chest pain.  Gastrointestinal: Negative for diarrhea, nausea and vomiting.  Genitourinary: Negative for dysuria.  Skin: Negative for rash.  Neurological: Negative for headaches.    Past Medical History:  Diagnosis Date  . Diabetes mellitus without complication ([place])   . HIV (human immunodeficiency virus infection) ([place]) 05/16/2019    Outpatient Medications Prior to Visit  Medication Sig Dispense Refill  . aspirin 81 MG chewable tablet Chew by mouth daily.    . bictegravir-emtricitabine-tenofovir AF (BIKTARVY) 50-200-25 MG TABS tablet Take 1 tablet by mouth daily. Try to take at the same time each day with or without food. 30 tablet 5  . dapsone 100 MG tablet Take 1 tablet (100 mg total) by mouth daily. 30 tablet 5  . glipiZIDE (GLUCOTROL XL) 10 MG 24 hr tablet Take 10 mg by mouth daily.    . hydrALAZINE (APRESOLINE) 25 MG tablet Take 1 tablet (25 mg total) by mouth 3 (three) times daily. 60 tablet 1  . metFORMIN (GLUCOPHAGE-XR) 500 MG 24 hr tablet Take 500 mg by mouth 2 (two) times daily.    Marland Kitchen triamcinolone ointment (KENALOG) 0.5 % Apply 1 application topically 2 (two)  times daily. 30 g 1   No facility-administered medications prior to visit.     Allergies  Allergen Reactions  . Sulfa Antibiotics Rash    Social History   Tobacco Use  . Smoking status: Never Smoker  . Smokeless tobacco: Never Used  Substance Use Topics  . Alcohol use: Yes    Alcohol/week: 1.0 standard drink    Types: 1 Glasses of wine per week  . Drug use: Never    Family History  Problem Relation Age of Onset  . Diabetes Mother     Social History   Substance and Sexual Activity  Sexual Activity Yes  .  Partners: Female     Objective:   There were no vitals filed for this visit. There is no height or weight on file to calculate BMI.  Physical Exam Pulmonary:     Effort: Pulmonary effort is normal.     Comments: No shortness of breath detected in conversation.  Neurological:     Mental Status: He is oriented to person, place, and time.  Psychiatric:        Mood and Affect: Mood normal.        Behavior: Behavior normal.        Thought Content: Thought content normal.        Judgment: Judgment normal.     Lab Results Lab Results  Component Value Date   WBC 3.3 (L) 04/26/2019   HGB 14.5 04/26/2019   HCT 41.7 04/26/2019   MCV 90.3 04/26/2019   PLT 207 04/26/2019    Lab Results  Component Value Date   CREATININE 0.82 06/16/2019   BUN 13 06/16/2019   NA 138 06/16/2019   K 4.1 06/16/2019   CL 102 06/16/2019   CO2 27 06/16/2019    Lab Results  Component Value Date   ALT 21 06/16/2019   AST 24 06/16/2019   BILITOT 0.5 06/16/2019    Lab Results  Component Value Date   CHOL 183 04/26/2019   HDL 30 (L) 04/26/2019   LDLCALC 128 (H) 04/26/2019   TRIG 140 04/26/2019   CHOLHDL 6.1 (H) 04/26/2019   HIV 1 RNA Quant (copies/mL)  Date Value  07/07/2019 130 (H)  06/16/2019 271 (H)  04/26/2019 341,000 (H)   CD4 T Cell Abs (/uL)  Date Value  07/07/2019 113 (L)  06/16/2019 84 (L)  04/26/2019 <35 (L)     Assessment & Plan:   Problem List Items Addressed This Visit      High   HIV (human immunodeficiency virus infection) ([place]) (Chronic)    Continue Biktarvy once daily. Will repeat viral load when he returns in 2 weeks for labs after acute illness resolves.  Sounds like he is doing well taking his medication correctly. Last VL was 130 copies.  RTC in 15motherwise and will begin recommended preventative vaccines at that visit.       AIDS (acquired immune deficiency syndrome) ([place] - Primary    Continue Dapsone once daily. Repeat CD4 when he is feeling better.         Relevant Orders   HIV-1 RNA quant-no reflex-bld   T-helper cell (CD4)- ([facility] clinic only)     Follow Up Instructions: Continue Biktarvy Continue Dapsone Labs in 2 weeks Would get tested for COVID if still having URI symptoms Claritin once daily for cough/runny nose if allergic    I discussed the assessment and treatment plan with the patient. The patient was provided an opportunity to ask questions and  all were answered. The patient agreed with the plan and demonstrated an understanding of the instructions.   The patient was advised to call back or seek an in-person evaluation if the symptoms worsen or if the condition fails to improve as anticipated.  I provided 11 minutes of non-face-to-face time during this encounter.   Janene Madeira, MSN, NP-C [facility] for Infectious [place] Pager: [phone] Office: [phone]  10/07/19  10:58 AM

## 2019-10-07 NOTE — Assessment & Plan Note (Signed)
Continue Dapsone once daily. Repeat CD4 when he is feeling better.

## 2019-10-07 NOTE — Patient Instructions (Addendum)
Try adding Claritin once a day to see if that helps with your cough and runny nose.   Even if you don't have a fever you could have symptoms of COVID so please consider getting tested to be certain if you are around other people indoors.   Please continue your BIktarvy once a day   Please call for a lab appointment in 2 weeks when you are feeling better to check your blood work.   Will plan to see you back in the office in 3-4 months also if you would make that appointment as well.

## 2019-10-07 NOTE — Assessment & Plan Note (Signed)
Continue Biktarvy once daily. Will repeat viral load when he returns in 2 weeks for labs after acute illness resolves.  Sounds like he is doing well taking his medication correctly. Last VL was 130 copies.  RTC in 26m otherwise and will begin recommended preventative vaccines at that visit.

## 2019-11-01 MED FILL — SULFAMETHOXAZOLE-TMP SUSP: 200-40 | 20 days supply | Qty: 200 | Fill #2

## 2019-11-01 MED FILL — BIKTARVY 50-200-25 MG TABS: 50-200-25 | 30 days supply | Qty: 30 | Fill #4

## 2019-11-30 MED FILL — BIKTARVY 50-200-25 MG TABS: 50-200-25 | 30 days supply | Qty: 30 | Fill #5

## 2019-12-13 ENCOUNTER — Other Ambulatory Visit: Payer: Self-pay | Admitting: Infectious Diseases

## 2020-01-06 ENCOUNTER — Telehealth: Payer: Self-pay

## 2020-01-06 NOTE — Telephone Encounter (Signed)
[facility] Patient Advocate Encounter  [facility] and I have been unsuccsessful in reaching patient to be able to refill medication.    We have tried multiple times without a response.  Daemian Gahm, CPhT Specialty Pharmacy Patient Advocate [facility] Phone: [phone] Fax:  [phone]  

## 2020-01-10 ENCOUNTER — Other Ambulatory Visit: Payer: Self-pay | Admitting: Infectious Diseases

## 2020-01-12 ENCOUNTER — Telehealth: Payer: Self-pay

## 2020-01-12 MED FILL — BIKTARVY 50-200-25 MG TABS: 50-200-25 | 30 days supply | Qty: 30 | Fill #0

## 2020-01-12 NOTE — Telephone Encounter (Signed)
Called patient to schedule a lab and office visit with [place] but voice mail has not been set up, will try again later.

## 2020-03-26 MED FILL — BIKTARVY 50-200-25 MG TABS: 50-200-25 | 30 days supply | Qty: 30 | Fill #1

## 2020-05-31 ENCOUNTER — Other Ambulatory Visit (HOSPITAL_COMMUNITY): Payer: Self-pay

## 2020-06-01 ENCOUNTER — Other Ambulatory Visit: Payer: Self-pay | Admitting: Infectious Diseases

## 2020-06-01 NOTE — Telephone Encounter (Signed)
Attempted to reach patient by phone today to offer overdue follow up appointment.  Patient has not had labs since 07/2019. Medication refill request denied until follow up appointment made. Last refill sent in Dec 2021. Including provider to make aware.  Valarie Cones

## 2020-06-04 ENCOUNTER — Other Ambulatory Visit (HOSPITAL_COMMUNITY): Payer: Self-pay

## 2020-06-04 IMAGING — DX DG CHEST 2V
2 series · 2 of 2 positions shown · non-contrast
Comparison: CT Abdomen and Pelvis 06/28/2015.

CLINICAL DATA: 48-year-old male with intermittent chest pain for 1
month.

EXAM:
CHEST - 2 VIEW

[dg chest 2 view (1 of 2)]
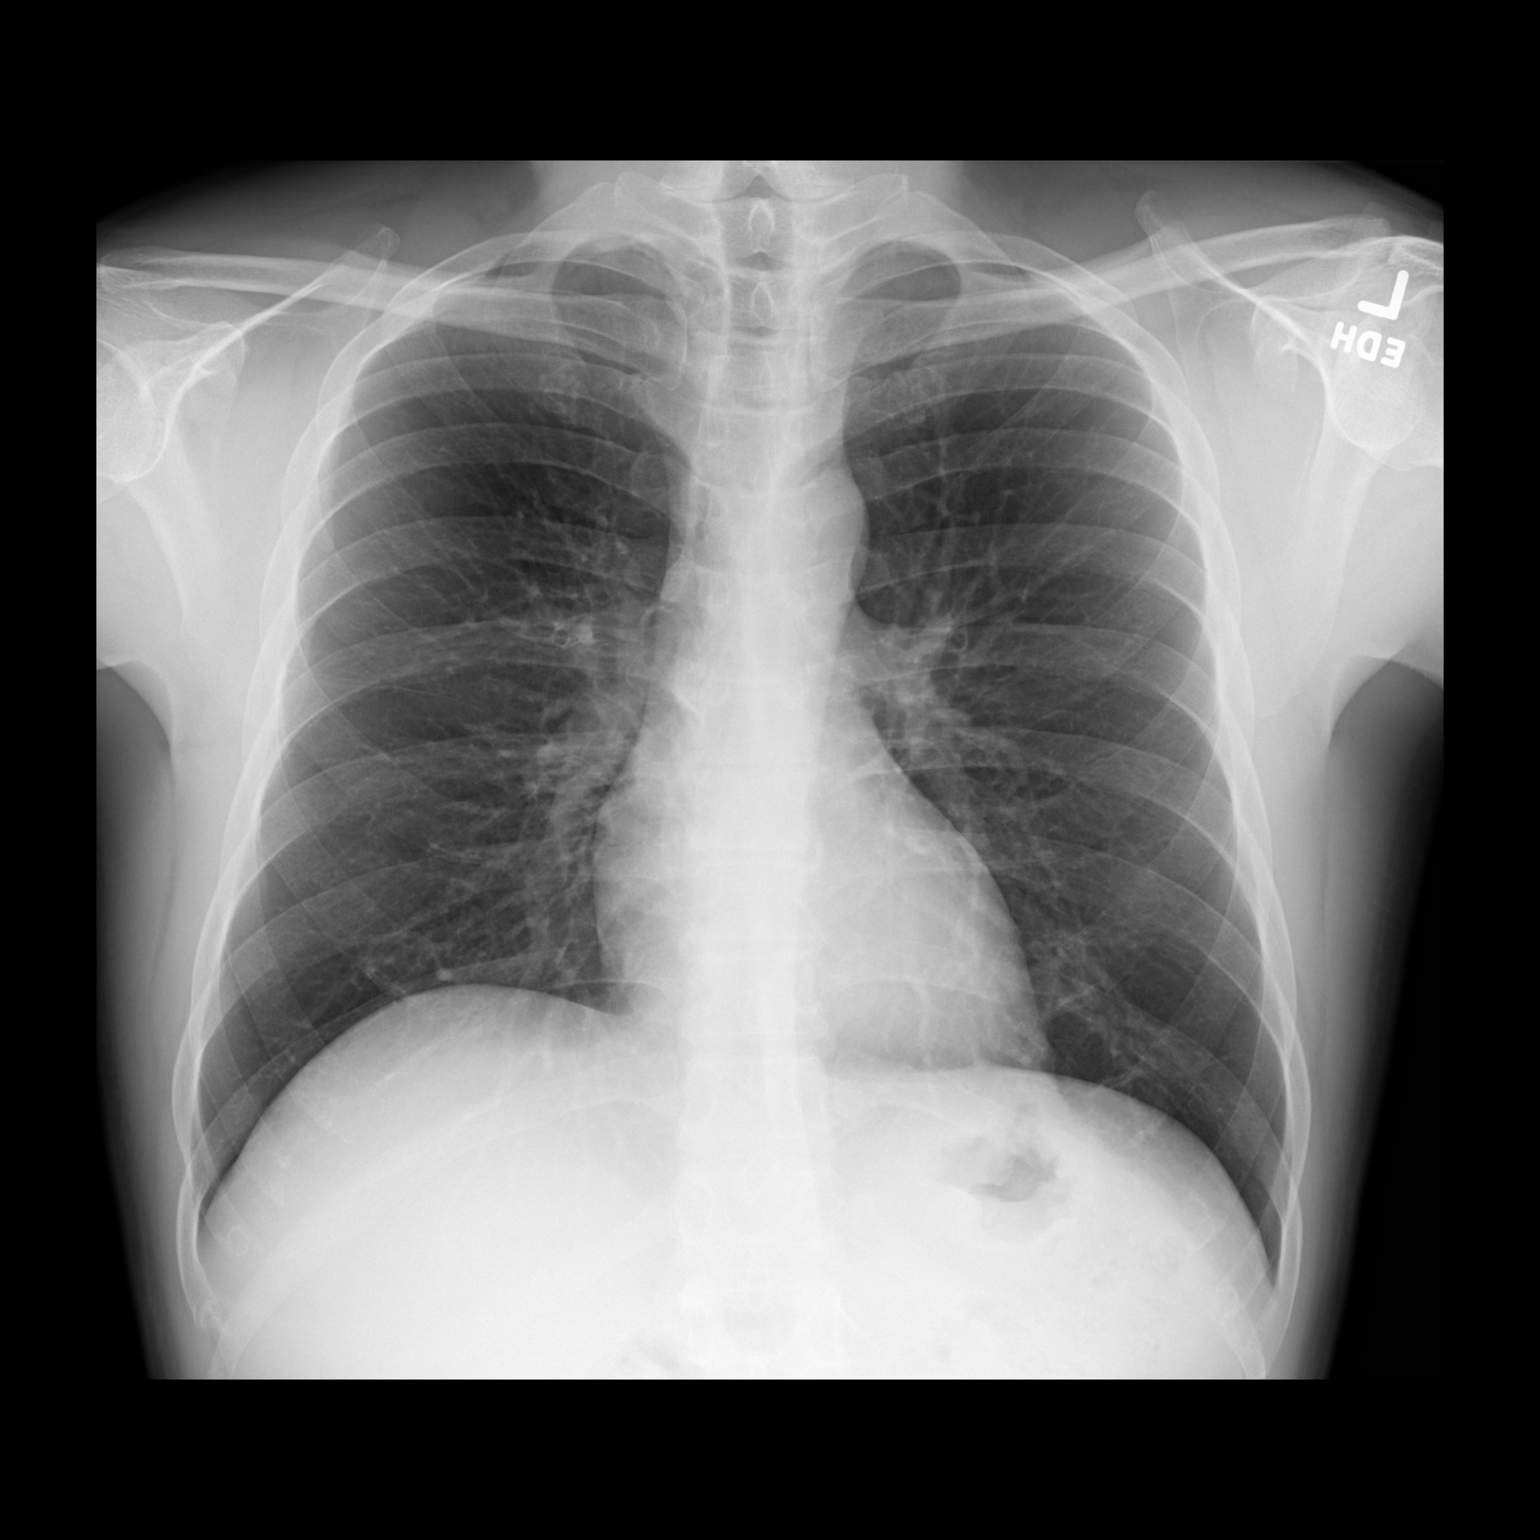

[dg chest 2 view (2 of 2)]
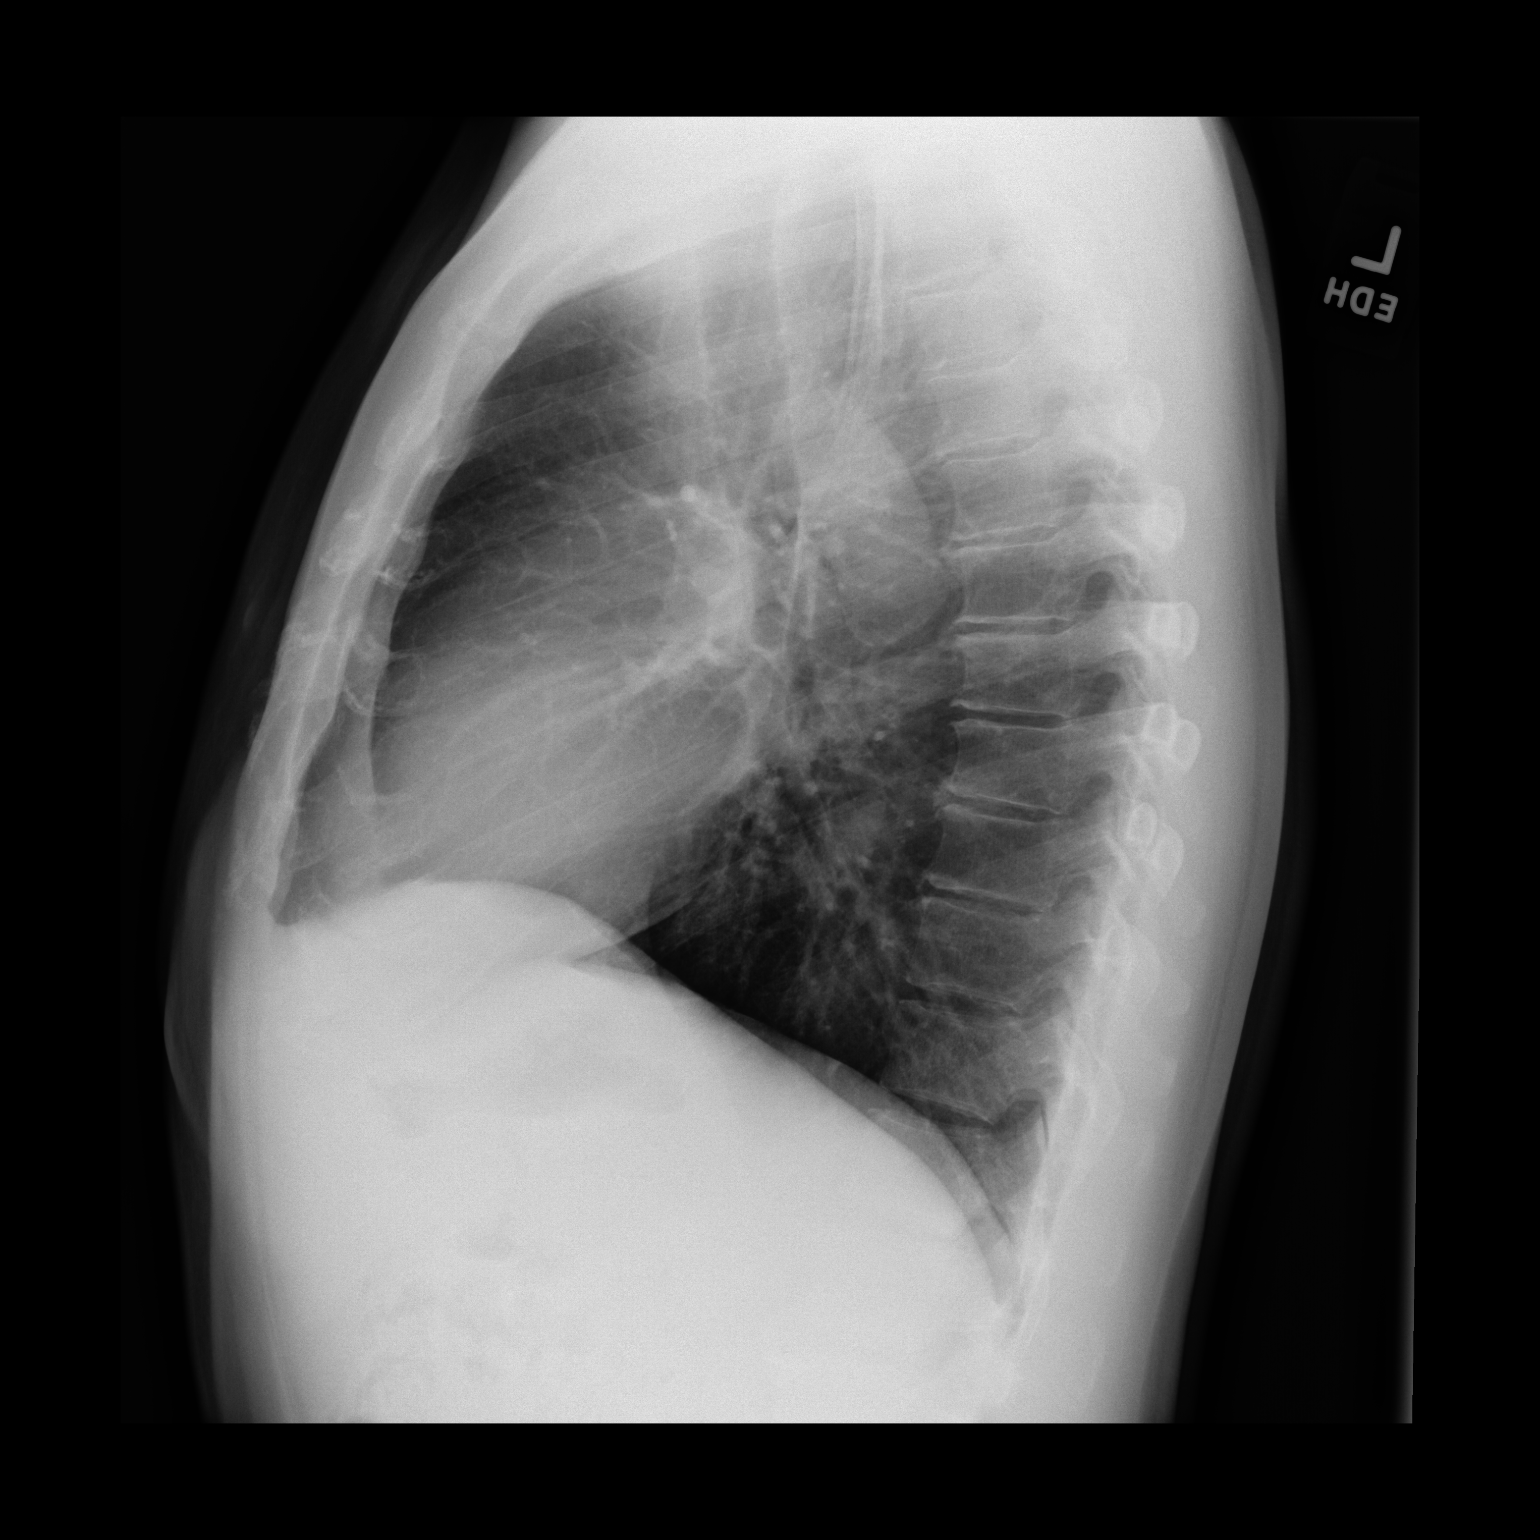

[2 of 2 positions shown; findings below may reference images not displayed]

FINDINGS: Normal lung volumes and mediastinal contours. Visualized tracheal
air column is within normal limits. Both lungs appear clear. No
pneumothorax or pleural effusion. No acute osseous abnormality
identified. Negative visible bowel gas pattern.
IMPRESSION: Negative.  No acute cardiopulmonary abnormality.

## 2020-06-05 ENCOUNTER — Other Ambulatory Visit (HOSPITAL_COMMUNITY): Payer: Self-pay

## 2020-06-18 ENCOUNTER — Other Ambulatory Visit: Payer: Self-pay | Admitting: Infectious Diseases

## 2020-06-18 ENCOUNTER — Telehealth: Payer: Self-pay

## 2020-06-18 NOTE — Telephone Encounter (Signed)
I reached out to patient in regards to him being past due for an appointment and labs. Patient reports he is currently in [place] and will be returning tomorrow 06/19/20. Patient reports he has missed a week of Biktarvy. I have scheduled patient with Tammy Sours 06/21/20.  Duan Scharnhorst T Pricilla Loveless

## 2020-06-21 ENCOUNTER — Other Ambulatory Visit: Payer: Self-pay

## 2020-06-21 ENCOUNTER — Encounter: Payer: Self-pay | Admitting: Family

## 2020-06-21 ENCOUNTER — Ambulatory Visit (INDEPENDENT_AMBULATORY_CARE_PROVIDER_SITE_OTHER): Payer: 59 | Admitting: Family

## 2020-06-21 VITALS — BP 142/94 | HR 76 | Wt 139.0 lb

## 2020-06-21 DIAGNOSIS — Z23 Encounter for immunization: Secondary | ICD-10-CM | POA: Diagnosis not present

## 2020-06-21 DIAGNOSIS — Z79899 Other long term (current) drug therapy: Secondary | ICD-10-CM | POA: Diagnosis not present

## 2020-06-21 DIAGNOSIS — Z0000 Encounter for general adult medical examination without abnormal findings: Secondary | ICD-10-CM | POA: Diagnosis not present

## 2020-06-21 DIAGNOSIS — Z113 Encounter for screening for infections with a predominantly sexual mode of transmission: Secondary | ICD-10-CM

## 2020-06-21 DIAGNOSIS — B20 Human immunodeficiency virus [HIV] disease: Secondary | ICD-10-CM

## 2020-06-21 MED ORDER — BICTEGRAVIR-EMTRICITAB-TENOFOV 50-200-25 MG PO TABS: 1.0000 | ORAL_TABLET | Freq: Every day | ORAL | 3 refills | Status: DC

## 2020-06-21 MED ORDER — TRIAMCINOLONE ACETONIDE 0.5 % EX OINT: 1.0000 "application " | TOPICAL_OINTMENT | Freq: Two times a day (BID) | CUTANEOUS | 1 refills | Status: AC

## 2020-06-21 MED ORDER — DAPSONE 100 MG PO TABS: 100.0000 mg | ORAL_TABLET | Freq: Every day | ORAL | 3 refills | Status: DC

## 2020-06-21 NOTE — Assessment & Plan Note (Signed)
Robert Rosario appears to have good adherence and tolerance to his ART regimen of Biktarvy although having run out of medication in the last week.  No signs/symptoms of opportunistic infection or progressive HIV.  We reviewed previous lab work and discussed plan of care.  Discussed importance of routine follow-up.  Check blood work today.  Continue current dose of Biktarvy.  Samples provided and recorded in pharmacy log.  Continue dapsone for OI prophylaxis.  Plan for follow-up in 3 months or sooner if needed with lab work on the same day.

## 2020-06-21 NOTE — Patient Instructions (Signed)
Nice to see you.  We will continue your current dose of Biktarvy and Dapsone.   We will check your blood work today.   Refills have been sent to the pharmacy.   Recommend routine dental care.   Plan for follow up with Rexene Alberts, NP in 3 months or sooner if needed.   Have a great day and stay safe!

## 2020-06-21 NOTE — Progress Notes (Signed)
Brief Narrative   Patient ID: Robert Rosario, male    DOB: January 14, 1971, 50 y.o.   MRN: [id number]  Robert Rosario is a 50 year old male with HIV/AIDS diagnosed February 2021 with CD4 nadir less than 35 and viral load of 341,000.  Genotype with Subtype B and K103R and V179D. Risk factor included MSM.  History of esophageal candidiasis.  No previous medication regimens prior to [facility].   Subjective:    Chief Complaint  Patient presents with  . Follow-up    Declined condoms; been off biktarvy about 1 week;      HPI:  Robert Rosario is a 50 y.o. male with HIV/AIDS last seen on 07/07/2019 with good clinical response to [place].  Viral load at the time was 130 with CD4 count of 113.  Has not been seen since last office visit and no recent blood work completed.  Here today for follow-up.  Robert Rosario has been out of medication for approximately 1 week having run out of medication due to lack of refills.  Overall feeling well today although does continue to have an itchy rash. Denies fevers, chills, night sweats, headaches, changes in vision, neck pain/stiffness, nausea, diarrhea, vomiting, lesions or rashes.  Robert Rosario is no problems obtaining medication from the pharmacy and remains covered through [place].  Denies feelings of being down, depressed, or hopeless recently.  No recreational or illicit drug use, tobacco use, or alcohol consumption.  Declines condoms.  Family was recently diagnosed with COVID which he was negative.  Declines condoms.  Due for routine dental care.  Healthcare maintenance due includes Prevnar and Pneumovax.  Continues to work second shift.   Allergies  Allergen Reactions  . Sulfa Antibiotics Rash      Outpatient Medications Prior to Visit  Medication Sig Dispense Refill  . glipiZIDE (GLUCOTROL XL) 10 MG 24 hr tablet Take 10 mg by mouth daily.    . hydrALAZINE (APRESOLINE) 25 MG tablet Take 1 tablet (25 mg total) by mouth 3 (three) times daily. 60 tablet 1  .  metFORMIN (GLUCOPHAGE-XR) 500 MG 24 hr tablet Take 500 mg by mouth 2 (two) times daily.    Marland Kitchen aspirin 81 MG chewable tablet Chew by mouth daily.    . dapsone 100 MG tablet TAKE 1 TABLET BY MOUTH EVERY DAY 30 tablet 0  . triamcinolone ointment (KENALOG) 0.5 % Apply 1 application topically 2 (two) times daily. 30 g 1   No facility-administered medications prior to visit.     Past Medical History:  Diagnosis Date  . Diabetes mellitus without complication (HCC)   . HIV (human immunodeficiency virus infection) (HCC) 05/16/2019     Past Surgical History:  Procedure Laterality Date  . WRIST SURGERY         Review of Systems  Constitutional: Negative for appetite change, chills, fatigue, fever and unexpected weight change.  Eyes: Negative for visual disturbance.  Respiratory: Negative for cough, chest tightness, shortness of breath and wheezing.   Cardiovascular: Negative for chest pain and leg swelling.  Gastrointestinal: Negative for abdominal pain, constipation, diarrhea, nausea and vomiting.  Genitourinary: Negative for dysuria, flank pain, frequency, genital sores, hematuria and urgency.  Skin: Negative for rash.  Allergic/Immunologic: Negative for immunocompromised state.  Neurological: Negative for dizziness and headaches.      Objective:    BP (!) 142/94   Pulse 76   Wt 139 lb (63 kg)   BMI 26.26 kg/m  Nursing note and vital signs reviewed.  Physical Exam Constitutional:  General: He is not in acute distress.    Appearance: He is well-developed.  Eyes:     Conjunctiva/sclera: Conjunctivae normal.  Cardiovascular:     Rate and Rhythm: Normal rate and regular rhythm.     Heart sounds: Normal heart sounds. No murmur heard. No friction rub. No gallop.   Pulmonary:     Effort: Pulmonary effort is normal. No respiratory distress.     Breath sounds: Normal breath sounds. No wheezing or rales.  Chest:     Chest wall: No tenderness.  Abdominal:     General: Bowel  sounds are normal.     Palpations: Abdomen is soft.     Tenderness: There is no abdominal tenderness.  Musculoskeletal:     Cervical back: Neck supple.  Lymphadenopathy:     Cervical: No cervical adenopathy.  Skin:    General: Skin is warm and dry.     Findings: No rash.  Neurological:     Mental Status: He is alert and oriented to person, place, and time.  Psychiatric:        Behavior: Behavior normal.        Thought Content: Thought content normal.        Judgment: Judgment normal.      Depression screen [facility] 2/9 10/07/2019 05/16/2019  Decreased Interest 0 0  Down, Depressed, Hopeless 0 0  PHQ - 2 Score 0 0       Assessment & Plan:    Patient Active Problem List   Diagnosis Date Noted  . Healthcare maintenance 06/21/2020  . Trouble in sleeping 07/07/2019  . Rash of body 06/17/2019  . Chest pain 06/17/2019  . Type 2 diabetes mellitus without complications (HCC) 05/16/2019  . HIV (human immunodeficiency virus infection) (HCC) 05/16/2019  . AIDS (acquired immune deficiency syndrome) (HCC) 05/16/2019     Problem List Items Addressed This Visit      Other   AIDS (acquired immune deficiency syndrome) (HCC) - Primary    Robert Rosario appears to have good adherence and tolerance to his ART regimen of Biktarvy although having run out of medication in the last week.  No signs/symptoms of opportunistic infection or progressive HIV.  We reviewed previous lab work and discussed plan of care.  Discussed importance of routine follow-up.  Check blood work today.  Continue current dose of Biktarvy.  Samples provided and recorded in pharmacy log.  Continue dapsone for OI prophylaxis.  Plan for follow-up in 3 months or sooner if needed with lab work on the same day.      Relevant Medications   bictegravir-emtricitabine-tenofovir AF (BIKTARVY) 50-200-25 MG TABS tablet   dapsone 100 MG tablet   Other Relevant Orders   HIV RNA, RTPCR W/R GT (RTI, PI,INT)   T-helper cell (CD4)- ([facility] clinic  only)   COMPLETE METABOLIC PANEL WITH GFR   Healthcare maintenance     Prevnar updated today.  Discussed importance of safe sexual practice to reduce risk of STI.  Condoms declined.  Due for routine dental care and will schedule independently.       Other Visit Diagnoses    Screening for STDs (sexually transmitted diseases)       Relevant Orders   RPR   Pharmacologic therapy       Relevant Orders   Lipid panel       I have changed Robert Rosario's dapsone. I am also having him start on bictegravir-emtricitabine-tenofovir AF. Additionally, I am having him maintain his glipiZIDE, metFORMIN, aspirin, hydrALAZINE,  and triamcinolone ointment.   Meds ordered this encounter  Medications  . bictegravir-emtricitabine-tenofovir AF (BIKTARVY) 50-200-25 MG TABS tablet    Sig: Take 1 tablet by mouth daily.    Dispense:  30 tablet    Refill:  3    Order Specific Question:   Supervising Provider    Answer:   Judyann Munson [[id number]]  . triamcinolone ointment (KENALOG) 0.5 %    Sig: Apply 1 application topically 2 (two) times daily.    Dispense:  30 g    Refill:  1    Order Specific Question:   Supervising Provider    Answer:   Judyann Munson [[id number]]  . dapsone 100 MG tablet    Sig: Take 1 tablet (100 mg total) by mouth daily.    Dispense:  30 tablet    Refill:  3    Order Specific Question:   Supervising Provider    Answer:   Judyann Munson [[id number]]     Follow-up: Return in about 3 months (around 09/20/2020), or if symptoms worsen or fail to improve.   Marcos Eke, MSN, FNP-C Nurse Practitioner [facility] Main number: [phone]

## 2020-06-21 NOTE — Assessment & Plan Note (Signed)
   Prevnar updated today.  Discussed importance of safe sexual practice to reduce risk of STI.  Condoms declined.  Due for routine dental care and will schedule independently.

## 2020-06-22 LAB — T-HELPER CELL (CD4) - (RCID CLINIC ONLY)
CD4 % Helper T Cell: 7 % — ABNORMAL LOW (ref 33–65)
CD4 T Cell Abs: 64 /uL — ABNORMAL LOW (ref 400–1790)

## 2020-06-26 ENCOUNTER — Other Ambulatory Visit (HOSPITAL_COMMUNITY): Payer: Self-pay

## 2020-07-01 LAB — LIPID PANEL
Cholesterol: 258 mg/dL — ABNORMAL HIGH (ref <200)
HDL: 39 mg/dL — ABNORMAL LOW (ref >=40)
LDL Cholesterol (Calc): 181 mg/dL (calc) — ABNORMAL HIGH
Non-HDL Cholesterol (Calc): 219 mg/dL (calc) — ABNORMAL HIGH (ref <130)
Total CHOL/HDL Ratio: 6.6 (calc) — ABNORMAL HIGH (ref <5.0)
Triglycerides: 215 mg/dL — ABNORMAL HIGH (ref <150)

## 2020-07-01 LAB — COMPLETE METABOLIC PANEL WITH GFR
AG Ratio: 1.5 (calc) (ref 1.0–2.5)
ALT: 34 U/L (ref 9–46)
AST: 25 U/L (ref 10–40)
Albumin: 4.4 g/dL (ref 3.6–5.1)
Alkaline phosphatase (APISO): 113 U/L (ref 36–130)
BUN: 13 mg/dL (ref 7–25)
CO2: 29 mmol/L (ref 20–32)
Calcium: 9.7 mg/dL (ref 8.6–10.3)
Chloride: 98 mmol/L (ref 98–110)
Creat: 0.76 mg/dL (ref 0.60–1.35)
GFR, Est African American: 124 mL/min/{1.73_m2} (ref >=60)
GFR, Est Non African American: 107 mL/min/{1.73_m2} (ref >=60)
Globulin: 2.9 g/dL (calc) (ref 1.9–3.7)
Glucose, Bld: 286 mg/dL — ABNORMAL HIGH (ref 65–99)
Potassium: 3.9 mmol/L (ref 3.5–5.3)
Sodium: 135 mmol/L (ref 135–146)
Total Bilirubin: 0.4 mg/dL (ref 0.2–1.2)
Total Protein: 7.3 g/dL (ref 6.1–8.1)

## 2020-07-01 LAB — HIV RNA, RTPCR W/R GT (RTI, PI,INT)
HIV 1 RNA Quant: 135000 copies/mL — ABNORMAL HIGH
HIV-1 RNA Quant, Log: 5.13 Log copies/mL — ABNORMAL HIGH

## 2020-07-01 LAB — HIV-1 INTEGRASE GENOTYPE

## 2020-07-01 LAB — RPR: RPR Ser Ql: NONREACTIVE

## 2020-07-01 LAB — HIV-1 GENOTYPE: HIV-1 Genotype: DETECTED — AB

## 2020-07-19 ENCOUNTER — Other Ambulatory Visit (HOSPITAL_COMMUNITY): Payer: Self-pay

## 2020-07-20 ENCOUNTER — Other Ambulatory Visit (HOSPITAL_COMMUNITY): Payer: Self-pay

## 2020-07-20 ENCOUNTER — Other Ambulatory Visit: Payer: Self-pay

## 2020-07-20 MED ORDER — BICTEGRAVIR-EMTRICITAB-TENOFOV 50-200-25 MG PO TABS
1.0000 | ORAL_TABLET | Freq: Every day | ORAL | 3 refills | Status: DC
  Filled 2020-07-20 – 2020-07-24 (×2): qty 30, 30d supply, fill #0

## 2020-07-24 ENCOUNTER — Other Ambulatory Visit (HOSPITAL_COMMUNITY): Payer: Self-pay

## 2020-08-14 ENCOUNTER — Other Ambulatory Visit (HOSPITAL_COMMUNITY): Payer: Self-pay

## 2020-09-27 ENCOUNTER — Ambulatory Visit (INDEPENDENT_AMBULATORY_CARE_PROVIDER_SITE_OTHER): Payer: 59 | Admitting: Infectious Diseases

## 2020-09-27 ENCOUNTER — Other Ambulatory Visit: Payer: Self-pay

## 2020-09-27 ENCOUNTER — Encounter: Payer: Self-pay | Admitting: Infectious Diseases

## 2020-09-27 ENCOUNTER — Other Ambulatory Visit (HOSPITAL_COMMUNITY): Payer: Self-pay

## 2020-09-27 VITALS — BP 128/91 | HR 85 | Temp 98.3°F | Wt 126.0 lb

## 2020-09-27 DIAGNOSIS — Z23 Encounter for immunization: Secondary | ICD-10-CM | POA: Diagnosis not present

## 2020-09-27 DIAGNOSIS — B20 Human immunodeficiency virus [HIV] disease: Secondary | ICD-10-CM

## 2020-09-27 DIAGNOSIS — R21 Rash and other nonspecific skin eruption: Secondary | ICD-10-CM

## 2020-09-27 DIAGNOSIS — R079 Chest pain, unspecified: Secondary | ICD-10-CM

## 2020-09-27 MED ORDER — BICTEGRAVIR-EMTRICITAB-TENOFOV 50-200-25 MG PO TABS
1.0000 | ORAL_TABLET | Freq: Every day | ORAL | 5 refills | Status: DC
  Filled 2020-09-27 (×2): qty 30, 30d supply, fill #0
  Filled 2020-11-21: qty 30, 30d supply, fill #1
  Filled 2021-03-26: qty 30, 30d supply, fill #2
  Filled 2021-04-23: qty 30, 30d supply, fill #3
  Filled 2021-05-21: qty 30, 30d supply, fill #4
  Filled 2021-06-24: qty 30, 30d supply, fill #5

## 2020-09-27 MED ORDER — DAPSONE 100 MG PO TABS: 100.0000 mg | ORAL_TABLET | Freq: Every day | ORAL | 11 refills | Status: DC

## 2020-09-27 NOTE — Progress Notes (Signed)
Name: Robert Rosario  DOB: [birth year] MRN: [id number] PCP: Cyndi Bender, PA-C     Brief Narrative:  Robert Rosario is a 50 y.o. male with HIV, AIDS(+) at entry to care, Dx 04/2019.  CD4 nadir < 35 VL 341,000 HIV Risk: MSM History of OIs: esophageal candidiasis  Intake Labs 04/2019: Hep B sAg (-), sAb (-), cAb (-); Hep A (+), Hep C (-) Quantiferon (-) HLA B*5701 (-) G6PD: ()   Previous Regimens: Biktarvy 05/2019 -->   Genotypes: 04/2019 - K103R, V179D   Subjective:   CC: HIV, AIDS+ follow up care    HPI: Robert Rosario is here today for follow-up care.  Last seen in the office in April with Marya Amsler.  He has been off medications in the past for various reasons. His girlfriend's daughter was hospitalized for a few months for chronic health condition and he lost his niece 2 months ago. Still grieving but doing better day by day.  With traveling back and forth to [place] during that period he was not always able to pick up his medications. Has been out of medication for diabetes for a while too.   He had an episode of chest pain/anxiety, right sided pain with unilateral weakness. Lasted just a minute or two. He was going to call EMS but did not. Only intermittent rashes here and there but nothing bothersome.   He needs to make a follow-up appointment with his PCP to resume care and get back on track for diabetes.  He plans on doing this today.  Reports no complaints today suggestive of associated opportunistic infection or advancing HIV disease such as fevers, night sweats, weight loss, anorexia, cough, SOB, nausea, vomiting, diarrhea, headache, sensory changes, lymphadenopathy or oral thrush.     Review of Systems  Constitutional:  Negative for chills and fever.  HENT:  Negative for tinnitus.   Eyes:  Negative for blurred vision and photophobia.  Respiratory:  Negative for cough and sputum production.   Cardiovascular:  Negative for chest pain.  Gastrointestinal:  Negative for  diarrhea, nausea and vomiting.  Genitourinary:  Negative for dysuria.  Skin:  Negative for rash.  Neurological:  Negative for headaches.    Past Medical History:  Diagnosis Date   Diabetes mellitus without complication ([place])    HIV (human immunodeficiency virus infection) ([place]) 05/16/2019    Outpatient Medications Prior to Visit  Medication Sig Dispense Refill   aspirin 81 MG chewable tablet Chew by mouth daily.     glipiZIDE (GLUCOTROL XL) 10 MG 24 hr tablet Take 10 mg by mouth daily.     metFORMIN (GLUCOPHAGE-XR) 500 MG 24 hr tablet Take 500 mg by mouth 2 (two) times daily.     triamcinolone ointment (KENALOG) 0.5 % Apply 1 application topically 2 (two) times daily. 30 g 1   bictegravir-emtricitabine-tenofovir AF (BIKTARVY) 50-200-25 MG TABS tablet Take 1 tablet by mouth daily. 30 tablet 3   dapsone 100 MG tablet Take 1 tablet (100 mg total) by mouth daily. 30 tablet 3   hydrALAZINE (APRESOLINE) 25 MG tablet Take 1 tablet (25 mg total) by mouth 3 (three) times daily. (Patient not taking: Reported on 09/27/2020) 60 tablet 1   No facility-administered medications prior to visit.     Allergies  Allergen Reactions   Sulfa Antibiotics Rash    Social History   Tobacco Use   Smoking status: Never   Smokeless tobacco: Never  Substance Use Topics   Alcohol use: Yes    Alcohol/week: 1.0  standard drink    Types: 1 Glasses of wine per week   Drug use: Never    Family History  Problem Relation Age of Onset   Diabetes Mother     Social History   Substance and Sexual Activity  Sexual Activity Yes   Partners: Female     Objective:   Vitals:   09/27/20 0853  BP: (!) 128/91  Pulse: 85  Temp: 98.3 F (36.8 C)  TempSrc: Oral  SpO2: 98%  Weight: 126 lb (57.2 kg)   Body mass index is 23.81 kg/m.  Physical Exam HENT:     Mouth/Throat:     Mouth: No oral lesions.     Dentition: Normal dentition. No dental caries.  Eyes:     General: No scleral  icterus. Cardiovascular:     Rate and Rhythm: Normal rate and regular rhythm.     Heart sounds: Normal heart sounds.  Pulmonary:     Effort: Pulmonary effort is normal.     Breath sounds: Normal breath sounds.  Abdominal:     General: There is no distension.     Palpations: Abdomen is soft.     Tenderness: There is no abdominal tenderness.  Lymphadenopathy:     Cervical: No cervical adenopathy.  Skin:    General: Skin is warm and dry.     Findings: No rash.  Neurological:     Mental Status: He is alert and oriented to person, place, and time.    Lab Results Lab Results  Component Value Date   WBC 3.3 (L) 04/26/2019   HGB 14.5 04/26/2019   HCT 41.7 04/26/2019   MCV 90.3 04/26/2019   PLT 207 04/26/2019    Lab Results  Component Value Date   CREATININE 0.76 06/21/2020   BUN 13 06/21/2020   NA 135 06/21/2020   K 3.9 06/21/2020   CL 98 06/21/2020   CO2 29 06/21/2020    Lab Results  Component Value Date   ALT 34 06/21/2020   AST 25 06/21/2020   BILITOT 0.4 06/21/2020    Lab Results  Component Value Date   CHOL 258 (H) 06/21/2020   HDL 39 (L) 06/21/2020   LDLCALC 181 (H) 06/21/2020   TRIG 215 (H) 06/21/2020   CHOLHDL 6.6 (H) 06/21/2020   HIV 1 RNA Quant (copies/mL)  Date Value  06/21/2020 135,000 (H)  07/07/2019 130 (H)  06/16/2019 271 (H)   CD4 T Cell Abs (/uL)  Date Value  06/21/2020 64 (L)  07/07/2019 113 (L)  06/16/2019 84 (L)     Assessment & Plan:   Problem List Items Addressed This Visit       High   Chest pain    Robert Rosario described an isolated event that consisted of chest pain/head pain, dizziness, right-sided weakness.  This seemed to be very transient and lasted only a few minutes.  Neurologic and cardiac exam were normal today.  I encouraged him that at this repeated itself he should seek attention in the ER for care.  Given his uncontrolled HIV and diabetes he is at risk for cardio thrombolic events.       AIDS (acquired immune  deficiency syndrome) ([place]) - Primary    Robert Rosario has had some troubles maintaining his medication of late with high burden and stressful family health situations.  His viral load at last visit in April was quite high with a CD4 count that dropped consistent with being off medication for a period of time.  He  has no AIDS defining conditions on exam or history today.  We discussed the importance of staying on his medication to help his immune system recover.  He will resume Biktarvy and dapsone once daily. I also asked him to please have his girlfriend in for STI/PrEP visit with our clinic.  She is uninsured.  I reassured him that this was not a barrier for [place] to take care of her.  He states that they are pretty good with condom use.  We will get his second pneumonia vaccine today.  Return in about 3 months (around 12/28/2020).        Relevant Medications   bictegravir-emtricitabine-tenofovir AF (BIKTARVY) 50-200-25 MG TABS tablet   dapsone 100 MG tablet   Other Relevant Orders   HIV-1 RNA quant-no reflex-bld     Unprioritized   Rash of body    This seems to be under good control, but has also been off of all of his medicines.  If this returns we may need to consider switching him to something different regarding ARV for presumed low-grade allergy.  Previously thought to be due to sulfa.        Other Visit Diagnoses     Need for pneumococcal vaccination       Relevant Orders   Pneumococcal polysaccharide vaccine 23-valent greater than or equal to 2yo subcutaneous/IM (Completed)      Janene Madeira, MSN, NP-C [facility] [place] Pager: [phone] Office: [phone]  09/27/20  12:05 PM

## 2020-09-27 NOTE — Patient Instructions (Signed)
  We gave you your second pneumonia vaccine today.   Try an antihistamine once a day (claritin, zyrtec or allegra) to see if it helps your runny nose.   Please have your girlfriend call for a new patient visit to make sure she has no HIV and that she can get prevention treatment.

## 2020-09-27 NOTE — Assessment & Plan Note (Signed)
This seems to be under good control, but has also been off of all of his medicines.  If this returns we may need to consider switching him to something different regarding ARV for presumed low-grade allergy.  Previously thought to be due to sulfa.

## 2020-09-27 NOTE — Assessment & Plan Note (Signed)
Mr. Bertone has had some troubles maintaining his medication of late with high burden and stressful family health situations.  His viral load at last visit in April was quite high with a CD4 count that dropped consistent with being off medication for a period of time.  He has no AIDS defining conditions on exam or history today.  We discussed the importance of staying on his medication to help his immune system recover.  He will resume Biktarvy and dapsone once daily. I also asked him to please have his girlfriend in for STI/PrEP visit with our clinic.  She is uninsured.  I reassured him that this was not a barrier for [place] to take care of her.  He states that they are pretty good with condom use.  We will get his second pneumonia vaccine today.  Return in about 3 months (around 12/28/2020).

## 2020-09-27 NOTE — Assessment & Plan Note (Signed)
Robert Rosario described an isolated event that consisted of chest pain/head pain, dizziness, right-sided weakness.  This seemed to be very transient and lasted only a few minutes.  Neurologic and cardiac exam were normal today.  I encouraged him that at this repeated itself he should seek attention in the ER for care.  Given his uncontrolled HIV and diabetes he is at risk for cardio thrombolic events.

## 2020-09-28 ENCOUNTER — Other Ambulatory Visit (HOSPITAL_COMMUNITY): Payer: Self-pay

## 2020-10-01 LAB — HIV-1 RNA QUANT-NO REFLEX-BLD
HIV 1 RNA Quant: 21600 Copies/mL — ABNORMAL HIGH
HIV-1 RNA Quant, Log: 4.33 Log cps/mL — ABNORMAL HIGH

## 2020-11-21 ENCOUNTER — Other Ambulatory Visit (HOSPITAL_COMMUNITY): Payer: Self-pay

## 2020-11-22 ENCOUNTER — Other Ambulatory Visit (HOSPITAL_COMMUNITY): Payer: Self-pay

## 2020-12-28 ENCOUNTER — Ambulatory Visit: Payer: 59 | Admitting: Infectious Diseases

## 2021-01-09 ENCOUNTER — Telehealth: Payer: Self-pay

## 2021-01-09 ENCOUNTER — Other Ambulatory Visit (HOSPITAL_COMMUNITY): Payer: Self-pay

## 2021-01-09 NOTE — Telephone Encounter (Signed)
Called patient to get a follow up scheduled in the next few weeks with Tammy Sours, no answer or voicemail set up.

## 2021-01-28 ENCOUNTER — Other Ambulatory Visit (HOSPITAL_COMMUNITY): Payer: Self-pay

## 2021-01-29 ENCOUNTER — Encounter: Payer: Self-pay | Admitting: Pharmacist

## 2021-02-07 ENCOUNTER — Telehealth: Payer: Self-pay

## 2021-02-07 NOTE — Telephone Encounter (Signed)
Voice mail has not been set up, patient needs a follow up with [place]. Will try again later

## 2021-02-19 ENCOUNTER — Telehealth: Payer: Self-pay | Admitting: Pharmacist

## 2021-02-19 NOTE — Telephone Encounter (Signed)
[facility] and the [facility] pharmacy clinic have been trying to reach patient to discuss adherence and schedule an appointment with Judeth Cornfield but have been unable to reach patient.   He last filled his Biktarvy on 11/22/20.  Jersie Beel L. Demarko Zeimet, PharmD [facility] Clinical Pharmacist Practitioner

## 2021-03-14 ENCOUNTER — Telehealth: Payer: Self-pay

## 2021-03-14 NOTE — Telephone Encounter (Signed)
Called patient to offer follow up, no answer and unable to leave voicemail.   Sandie Ano, RN

## 2021-03-26 ENCOUNTER — Other Ambulatory Visit (HOSPITAL_COMMUNITY): Payer: Self-pay

## 2021-04-11 ENCOUNTER — Other Ambulatory Visit (HOSPITAL_COMMUNITY): Payer: Self-pay

## 2021-04-22 ENCOUNTER — Other Ambulatory Visit (HOSPITAL_COMMUNITY): Payer: Self-pay

## 2021-04-23 ENCOUNTER — Other Ambulatory Visit (HOSPITAL_COMMUNITY): Payer: Self-pay

## 2021-04-24 ENCOUNTER — Other Ambulatory Visit (HOSPITAL_COMMUNITY): Payer: Self-pay

## 2021-05-16 ENCOUNTER — Other Ambulatory Visit (HOSPITAL_COMMUNITY): Payer: Self-pay

## 2021-05-20 ENCOUNTER — Other Ambulatory Visit (HOSPITAL_COMMUNITY): Payer: Self-pay

## 2021-05-21 ENCOUNTER — Other Ambulatory Visit (HOSPITAL_COMMUNITY): Payer: Self-pay

## 2021-06-11 ENCOUNTER — Other Ambulatory Visit (HOSPITAL_COMMUNITY): Payer: Self-pay

## 2021-06-24 ENCOUNTER — Other Ambulatory Visit (HOSPITAL_COMMUNITY): Payer: Self-pay

## 2021-06-25 ENCOUNTER — Other Ambulatory Visit (HOSPITAL_COMMUNITY): Payer: Self-pay

## 2021-07-04 ENCOUNTER — Other Ambulatory Visit (HOSPITAL_COMMUNITY): Payer: Self-pay

## 2021-07-10 ENCOUNTER — Other Ambulatory Visit (HOSPITAL_COMMUNITY): Payer: Self-pay

## 2021-07-10 ENCOUNTER — Telehealth: Payer: Self-pay

## 2021-07-10 ENCOUNTER — Other Ambulatory Visit: Payer: Self-pay | Admitting: Infectious Diseases

## 2021-07-10 MED ORDER — BIKTARVY 50-200-25 MG PO TABS
1.0000 | ORAL_TABLET | Freq: Every day | ORAL | 0 refills | Status: DC
  Filled 2021-07-10: qty 30, 30d supply, fill #0

## 2021-07-10 NOTE — Telephone Encounter (Signed)
Called to make patient aware of 30 day refill on Biktarvy and to schedule for appointment. No answer. Voicemail not set up, unable to leave message. Additional message sent through MyChart. ? ?Wyvonne Lenz, RN  ?

## 2021-07-22 ENCOUNTER — Other Ambulatory Visit (HOSPITAL_COMMUNITY): Payer: Self-pay

## 2021-08-02 ENCOUNTER — Other Ambulatory Visit (HOSPITAL_COMMUNITY): Payer: Self-pay

## 2021-08-07 ENCOUNTER — Other Ambulatory Visit (HOSPITAL_COMMUNITY): Payer: Self-pay

## 2021-08-12 ENCOUNTER — Other Ambulatory Visit (HOSPITAL_COMMUNITY): Payer: Self-pay

## 2021-08-12 ENCOUNTER — Other Ambulatory Visit: Payer: Self-pay | Admitting: Infectious Diseases

## 2021-08-12 DIAGNOSIS — B20 Human immunodeficiency virus [HIV] disease: Secondary | ICD-10-CM

## 2021-08-13 ENCOUNTER — Other Ambulatory Visit (HOSPITAL_COMMUNITY): Payer: Self-pay

## 2021-08-13 MED ORDER — BIKTARVY 50-200-25 MG PO TABS
1.0000 | ORAL_TABLET | Freq: Every day | ORAL | 0 refills | Status: DC
  Filled 2021-08-13: qty 30, 30d supply, fill #0

## 2021-08-14 ENCOUNTER — Other Ambulatory Visit (HOSPITAL_COMMUNITY): Payer: Self-pay

## 2021-08-15 ENCOUNTER — Other Ambulatory Visit (HOSPITAL_COMMUNITY): Payer: Self-pay

## 2021-09-11 ENCOUNTER — Other Ambulatory Visit (HOSPITAL_COMMUNITY): Payer: Self-pay

## 2021-09-18 ENCOUNTER — Other Ambulatory Visit (HOSPITAL_COMMUNITY): Payer: Self-pay

## 2022-11-17 ENCOUNTER — Other Ambulatory Visit (HOSPITAL_COMMUNITY): Payer: Self-pay

## 2022-11-17 ENCOUNTER — Ambulatory Visit (INDEPENDENT_AMBULATORY_CARE_PROVIDER_SITE_OTHER): Payer: 59 | Admitting: Infectious Diseases

## 2022-11-17 ENCOUNTER — Other Ambulatory Visit: Payer: Self-pay

## 2022-11-17 ENCOUNTER — Encounter: Payer: Self-pay | Admitting: Pharmacist

## 2022-11-17 ENCOUNTER — Encounter: Payer: Self-pay | Admitting: Infectious Diseases

## 2022-11-17 VITALS — BP 120/79 | HR 77 | Resp 16 | Ht 61.0 in | Wt 119.3 lb

## 2022-11-17 DIAGNOSIS — B370 Candidal stomatitis: Secondary | ICD-10-CM | POA: Diagnosis not present

## 2022-11-17 DIAGNOSIS — F4320 Adjustment disorder, unspecified: Secondary | ICD-10-CM

## 2022-11-17 DIAGNOSIS — B20 Human immunodeficiency virus [HIV] disease: Secondary | ICD-10-CM | POA: Diagnosis not present

## 2022-11-17 DIAGNOSIS — F1090 Alcohol use, unspecified, uncomplicated: Secondary | ICD-10-CM | POA: Diagnosis not present

## 2022-11-17 DIAGNOSIS — F4321 Adjustment disorder with depressed mood: Secondary | ICD-10-CM | POA: Insufficient documentation

## 2022-11-17 MED ORDER — DAPSONE 100 MG PO TABS
100.0000 mg | ORAL_TABLET | Freq: Every day | ORAL | 5 refills | Status: DC
Start: 2022-11-17 — End: 2022-12-25
  Filled 2022-11-17: qty 30, 30d supply, fill #0

## 2022-11-17 MED ORDER — FLUCONAZOLE 150 MG PO TABS: 150.0000 mg | ORAL_TABLET | Freq: Every day | ORAL | 0 refills | Status: DC

## 2022-11-17 MED ORDER — BIKTARVY 50-200-25 MG PO TABS
1.0000 | ORAL_TABLET | Freq: Every day | ORAL | 5 refills | Status: DC
  Filled 2022-11-17: qty 30, 30d supply, fill #0
  Filled 2022-12-05 – 2022-12-15 (×3): qty 30, 30d supply, fill #1

## 2022-11-17 NOTE — Assessment & Plan Note (Signed)
He has in the period of time being off ART progressed with buccal thrush and weight loss of 30#.  Will refill dapsone for OI proph as well.  Close follow up to see if any changes or unmasked symptoms.

## 2022-11-17 NOTE — Assessment & Plan Note (Signed)
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  He is more aware of this being  a self medicating tactic and motivated to decrease and discontinue use for his health. He is working with his brother (whom is an MD in [place]). He does not have any physical w/drawal symptoms, counseled as to what they would include.   I asked him to please reach out to me if he needs more resources for quitting. His local PCP would be helpful as well.

## 2022-11-17 NOTE — Assessment & Plan Note (Signed)
Off consistent ART for about a year now. Has been dosing intermittently - counseled on avoiding intermittent dosing today to ensure no emergence of resistance. Genotype collected to ensure OK to continue Biktarvy monotherapy.  Patient assistance still active at [facility] - will arrange to mail this to him.  HIV VL, CMP, CD4, RPR, CBC collected today  No changes to insurance coverage.  No dental needs today.  No concern over anxious/depressed mood.  Sexual health and family planning discussed - deferred discussion given other more sensitive discussion points.  Has had flu shot.    Return in about 6 weeks (around 12/29/2022).

## 2022-11-17 NOTE — Progress Notes (Signed)
Name: Robert Rosario  DOB: 1970-06-04 MRN: [id number] PCP: Lonie Peak, PA-C     Brief Narrative:  Robert Rosario is a 52 y.o. male with HIV, AIDS(+) at entry to care, Dx 04/2019.  CD4 nadir < 35 VL 341,000 HIV Risk: MSM History of OIs: esophageal candidiasis  Intake Labs 04/2019: Hep B sAg (-), sAb (-), cAb (-); Hep A (+), Hep C (-) Quantiferon (-) HLA B*5701 (-) G6PD: ()   Previous Regimens: Biktarvy 05/2019 -->   Genotypes: 04/2019 - K103R, V179D   Subjective:   CC: HIV, AIDS+ follow up care Lots of loss last year in 2023 and fell out of care.     HPI: Robert Rosario is here for follow up HIV. In the last year he has stopped taking care of himself - his girlfriend passed away at home, lost several dogs as well and has been stricken by severe grief and depression. Attempted SI in the past. Turned to alcohol - 24 pack a day at times - more on weekend when he was not working. He has tried to cut down on this since. Now estimates about 24 pack Saturday and 24 pack Sundays. He is working with his brother for support as he navigates his grief but feels like his mood is improved. Denies any SI   Had access to Biktarvy left over due to intermittent dosing. He was taking 1 pill twice a week.  He has lost weight (about 30#) and has thrush on lips at present, mild dysphagia occasionally.   Aside from the above he denies any symptoms of other associated opportunistic infection or advancing HIV disease such as fevers, night sweats, anorexia, cough, SOB, nausea, vomiting, diarrhea, headache, sensory changes, lymphadenopathy.    Review of Systems  Constitutional:  Negative for chills and fever.  HENT:  Negative for tinnitus.        Buccal thrush   Eyes:  Negative for blurred vision and photophobia.  Respiratory:  Negative for cough and sputum production.   Cardiovascular:  Negative for chest pain.  Gastrointestinal:  Negative for diarrhea, nausea and vomiting.  Genitourinary:  Negative  for dysuria.  Skin:  Negative for rash.  Neurological:  Negative for headaches.     Past Medical History:  Diagnosis Date   Diabetes mellitus without complication (HCC)    HIV (human immunodeficiency virus infection) (HCC) 05/16/2019    Outpatient Medications Prior to Visit  Medication Sig Dispense Refill   aspirin 81 MG chewable tablet Chew by mouth daily.     glipiZIDE (GLUCOTROL XL) 10 MG 24 hr tablet Take 10 mg by mouth daily.     metFORMIN (GLUCOPHAGE-XR) 500 MG 24 hr tablet Take 500 mg by mouth 2 (two) times daily.     hydrALAZINE (APRESOLINE) 25 MG tablet Take 1 tablet (25 mg total) by mouth 3 (three) times daily. (Patient not taking: Reported on 09/27/2020) 60 tablet 1   triamcinolone ointment (KENALOG) 0.5 % Apply 1 application topically 2 (two) times daily. (Patient not taking: Reported on 11/17/2022) 30 g 1   bictegravir-emtricitabine-tenofovir AF (BIKTARVY) 50-200-25 MG TABS tablet Take 1 tablet by mouth daily. (Patient not taking: Reported on 11/17/2022) 30 tablet 0   dapsone 100 MG tablet Take 1 tablet (100 mg total) by mouth daily. (Patient not taking: Reported on 11/17/2022) 30 tablet 11   No facility-administered medications prior to visit.     Allergies  Allergen Reactions   Sulfa Antibiotics Rash    Social History   Tobacco Use  Smoking status: Never   Smokeless tobacco: Never  Substance Use Topics   Alcohol use: Yes    Alcohol/week: 1.0 standard drink of alcohol    Types: 1 Glasses of wine per week   Drug use: Never    Family History  Problem Relation Age of Onset   Diabetes Mother     Social History   Substance and Sexual Activity  Sexual Activity Yes   Partners: Female     Objective:   Vitals:   11/17/22 0903  BP: 120/79  Pulse: 77  Resp: 16  Weight: 119 lb 4.8 oz (54.1 kg)  Height: 5\' 1"  (1.549 m)   Body mass index is 22.54 kg/m.  Physical Exam HENT:     Mouth/Throat:     Mouth: No oral lesions.     Dentition: Normal  dentition. No dental caries.     Comments: Buccal thrush patches noted. Nothing on posterior pharynx or tongue.  Eyes:     General: No scleral icterus. Cardiovascular:     Rate and Rhythm: Normal rate and regular rhythm.     Heart sounds: Normal heart sounds.  Pulmonary:     Effort: Pulmonary effort is normal.     Breath sounds: Normal breath sounds.  Abdominal:     General: There is no distension.     Palpations: Abdomen is soft.     Tenderness: There is no abdominal tenderness.  Lymphadenopathy:     Cervical: No cervical adenopathy.  Skin:    General: Skin is warm and dry.     Findings: No rash.  Neurological:     Mental Status: He is alert and oriented to person, place, and time.     Lab Results Lab Results  Component Value Date   WBC 3.3 (L) 04/26/2019   HGB 14.5 04/26/2019   HCT 41.7 04/26/2019   MCV 90.3 04/26/2019   PLT 207 04/26/2019    Lab Results  Component Value Date   CREATININE 0.76 06/21/2020   BUN 13 06/21/2020   NA 135 06/21/2020   K 3.9 06/21/2020   CL 98 06/21/2020   CO2 29 06/21/2020    Lab Results  Component Value Date   ALT 34 06/21/2020   AST 25 06/21/2020   BILITOT 0.4 06/21/2020    Lab Results  Component Value Date   CHOL 258 (H) 06/21/2020   HDL 39 (L) 06/21/2020   LDLCALC 181 (H) 06/21/2020   TRIG 215 (H) 06/21/2020   CHOLHDL 6.6 (H) 06/21/2020   HIV 1 RNA Quant  Date Value  09/27/2020 21,600 Copies/mL (H)  06/21/2020 135,000 copies/mL (H)  07/07/2019 130 copies/mL (H)   CD4 T Cell Abs (/uL)  Date Value  06/21/2020 64 (L)  07/07/2019 113 (L)  06/16/2019 84 (L)     Assessment & Plan:   Problem List Items Addressed This Visit       High   HIV (human immunodeficiency virus infection) (HCC) (Chronic)    Off consistent ART for about a year now. Has been dosing intermittently - counseled on avoiding intermittent dosing today to ensure no emergence of resistance. Genotype collected to ensure OK to continue Biktarvy  monotherapy.  Patient assistance still active at [facility] - will arrange to mail this to him.  HIV VL, CMP, CD4, RPR, CBC collected today  No changes to insurance coverage.  No dental needs today.  No concern over anxious/depressed mood.  Sexual health and family planning discussed - deferred discussion given other more sensitive discussion  points.  Has had flu shot.    Return in about 6 weeks (around 12/29/2022).        Relevant Medications   fluconazole (DIFLUCAN) 150 MG tablet   bictegravir-emtricitabine-tenofovir AF (BIKTARVY) 50-200-25 MG TABS tablet   dapsone 100 MG tablet   AIDS (acquired immune deficiency syndrome) (HCC) - Primary    He has in the period of time being off ART progressed with buccal thrush and weight loss of 30#.  Will refill dapsone for OI proph as well.  Close follow up to see if any changes or unmasked symptoms.       Relevant Medications   fluconazole (DIFLUCAN) 150 MG tablet   bictegravir-emtricitabine-tenofovir AF (BIKTARVY) 50-200-25 MG TABS tablet   dapsone 100 MG tablet   Other Relevant Orders   HIV RNA, RTPCR W/R GT (RTI, PI,INT)   T-helper cells (CD4) count   COMPLETE METABOLIC PANEL WITH GFR   CBC   RPR     Unprioritized   Thrush    Rx for fluconazole that is on his formulary provided - 150 mg tab daily x 7d.       Relevant Medications   fluconazole (DIFLUCAN) 150 MG tablet   bictegravir-emtricitabine-tenofovir AF (BIKTARVY) 50-200-25 MG TABS tablet   dapsone 100 MG tablet   Grief reaction   Alcohol use disorder    Heavy ETOH use in the context of severe grief reaction / depression.  He is more aware of this being  a self medicating tactic and motivated to decrease and discontinue use for his health. He is working with his brother (whom is an MD in [place]). He does not have any physical w/drawal symptoms, counseled as to what they would include.   I asked him to please reach out to me if he needs more resources for quitting. His local  PCP would be helpful as well.        Rexene Alberts, MSN, NP-C [facility] Pager: [phone] Office: [phone]  11/17/22  9:41 AM

## 2022-11-17 NOTE — Patient Instructions (Addendum)
Start the fluconazole 1 pill once a day for the thrush in your mouth   Start the biktarvy back - 1 pill once a day please.   Return in about 6 weeks (around 12/29/2022).

## 2022-11-17 NOTE — Assessment & Plan Note (Signed)
Rx for fluconazole that is on his formulary provided - 150 mg tab daily x 7d.

## 2022-11-18 LAB — T-HELPER CELLS (CD4) COUNT (NOT AT ARMC)
CD4 % Helper T Cell: 4 % — ABNORMAL LOW (ref 33–65)
CD4 T Cell Abs: <35 /uL — ABNORMAL LOW (ref 400–1790)

## 2022-11-19 ENCOUNTER — Other Ambulatory Visit: Payer: Self-pay

## 2022-11-27 LAB — HIV RNA, RTPCR W/R GT (RTI, PI,INT)
HIV 1 RNA Quant: 39200 copies/mL — ABNORMAL HIGH
HIV-1 RNA Quant, Log: 4.59 Log copies/mL — ABNORMAL HIGH

## 2022-11-27 LAB — CBC
HCT: 42.5 % (ref 38.5–50.0)
Hemoglobin: 14.6 g/dL (ref 13.2–17.1)
MCH: 33.1 pg — ABNORMAL HIGH (ref 27.0–33.0)
MCHC: 34.4 g/dL (ref 32.0–36.0)
MCV: 96.4 fL (ref 80.0–100.0)
MPV: 10.5 fL (ref 7.5–12.5)
Platelets: 246 10*3/uL (ref 140–400)
RBC: 4.41 10*6/uL (ref 4.20–5.80)
RDW: 11.5 % (ref 11.0–15.0)
WBC: 4.3 10*3/uL (ref 3.8–10.8)

## 2022-11-27 LAB — COMPLETE METABOLIC PANEL WITH GFR
AG Ratio: 1 (calc) (ref 1.0–2.5)
ALT: 49 U/L — ABNORMAL HIGH (ref 9–46)
AST: 42 U/L — ABNORMAL HIGH (ref 10–35)
Albumin: 3.7 g/dL (ref 3.6–5.1)
Alkaline phosphatase (APISO): 97 U/L (ref 35–144)
BUN: 16 mg/dL (ref 7–25)
CO2: 29 mmol/L (ref 20–32)
Calcium: 9.4 mg/dL (ref 8.6–10.3)
Chloride: 99 mmol/L (ref 98–110)
Creat: 0.81 mg/dL (ref 0.70–1.30)
Globulin: 3.6 g/dL (calc) (ref 1.9–3.7)
Glucose, Bld: 377 mg/dL — ABNORMAL HIGH (ref 65–99)
Potassium: 4.3 mmol/L (ref 3.5–5.3)
Sodium: 135 mmol/L (ref 135–146)
Total Bilirubin: 0.5 mg/dL (ref 0.2–1.2)
Total Protein: 7.3 g/dL (ref 6.1–8.1)
eGFR: 106 mL/min/{1.73_m2} (ref >=60)

## 2022-11-27 LAB — HIV-1 INTEGRASE GENOTYPE

## 2022-11-27 LAB — RPR: RPR Ser Ql: NONREACTIVE

## 2022-11-27 LAB — HIV-1 GENOTYPE: HIV-1 Genotype: DETECTED — AB

## 2022-11-28 ENCOUNTER — Other Ambulatory Visit: Payer: Self-pay

## 2022-11-28 MED ORDER — DORAVIRINE 100 MG PO TABS
100.0000 mg | ORAL_TABLET | Freq: Every day | ORAL | 11 refills | Status: AC
  Filled 2022-12-01 – 2022-12-15 (×3): qty 30, 30d supply, fill #0
  Filled 2023-01-07: qty 30, 30d supply, fill #1
  Filled 2023-01-27: qty 30, 30d supply, fill #2
  Filled 2023-03-11: qty 30, 30d supply, fill #3
  Filled 2023-04-06: qty 30, 30d supply, fill #4
  Filled 2023-05-04: qty 30, 30d supply, fill #5
  Filled 2023-05-28: qty 30, 30d supply, fill #6

## 2022-11-28 NOTE — Progress Notes (Addendum)
GENOSURE RESULTS:  Will continue the patient on biktarvy - d/w ID pharm team and will add pifeltro for him for salvage therapy given parital TAF activity    Integrase Strand Inhibitors:  No mutations   Protease Inhibitors: No major mutations  PR mutations: K43KR, L63P, A71T, V77I ,I93L  atazanavir/r (ATV/r) Susceptible darunavir/r (DRV/r) Susceptible lopinavir/r (LPV/r) Susceptible   NN Reverse Transcriptase Inhibitors NNRTI Mutations: V179D RT Other Mutations: K103R  abacavir (ABC) Intermediate Resistance zidovudine (AZT) Susceptible emtricitabine (FTC) High-Level Resistance lamivudine (3TC) High-Level Resistance tenofovir (TDF) Low-Level Resistance  NRTI Mutations: K70KE, M184V Reverse Transcriptase Inhibitors:  Non-nucleoside Reverse Transcriptase Inhibitors doravirine (DOR) Susceptible efavirenz (EFV) Intermediate Resistance etravirine (ETR) Potential Low-Level Resistance nevirapine (NVP) Intermediate Resistance rilpivirine (RPV) Low-Level Resistance  Comments:

## 2022-11-28 NOTE — Addendum Note (Signed)
Addended by: Blanchard Kelch on: 11/28/2022 01:43 PM   Modules accepted: Orders

## 2022-12-01 ENCOUNTER — Other Ambulatory Visit (HOSPITAL_COMMUNITY): Payer: Self-pay

## 2022-12-01 ENCOUNTER — Other Ambulatory Visit: Payer: Self-pay

## 2022-12-01 ENCOUNTER — Telehealth: Payer: Self-pay

## 2022-12-01 NOTE — Telephone Encounter (Signed)
Spoke with patient to inform him medication add on. No questions at this time. Juanita Laster, RMA

## 2022-12-01 NOTE — Telephone Encounter (Signed)
-----   Message from Peralta sent at 11/28/2022  1:43 PM EDT ----- Regarding: Please call Hi Team - will you follow up with him an try to reach him on Monday re: need to add Pifeltro to his biktarvy?  We will probably need a PA for it from what I can see.   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   Was hopeful to avoid prezcobix  with other meds - maybe pifeltro + biktarvy if something else needed?

## 2022-12-05 ENCOUNTER — Other Ambulatory Visit: Payer: Self-pay

## 2022-12-05 ENCOUNTER — Telehealth: Payer: Self-pay

## 2022-12-05 ENCOUNTER — Other Ambulatory Visit (HOSPITAL_COMMUNITY): Payer: Self-pay

## 2022-12-05 ENCOUNTER — Other Ambulatory Visit (HOSPITAL_COMMUNITY): Payer: Self-pay | Admitting: Pharmacy Technician

## 2022-12-05 NOTE — Progress Notes (Signed)
Specialty Pharmacy Refill Coordination Note  Robert Rosario is a 52 y.o. male contacted today regarding refills of specialty medication(s) Bictegravir-Emtricitab-Tenofov; Doravirine   Patient requested Delivery   Delivery date: 12/15/22   Verified address: [address] [place], [place]   Medication will be filled on 12/12/22.

## 2022-12-05 NOTE — Telephone Encounter (Signed)
[facility] Patient Advocate Encounter   Received notification from [facility] that prior authorization for Pifeltro is required.   PA submitted on 12/05/22 Key [id number] Status is pending    [facility] will continue to follow.   Clearance Coots, CPhT Specialty Pharmacy Patient [facility] Phone: [phone] Fax:  [phone]

## 2022-12-09 ENCOUNTER — Other Ambulatory Visit (HOSPITAL_COMMUNITY): Payer: Self-pay

## 2022-12-09 NOTE — Progress Notes (Signed)
Pifeltro was not filled on 9/16 will be filled on 12/12/22

## 2022-12-12 ENCOUNTER — Other Ambulatory Visit (HOSPITAL_COMMUNITY): Payer: Self-pay

## 2022-12-15 ENCOUNTER — Other Ambulatory Visit (HOSPITAL_COMMUNITY): Payer: Self-pay

## 2022-12-15 ENCOUNTER — Other Ambulatory Visit: Payer: Self-pay

## 2022-12-17 ENCOUNTER — Other Ambulatory Visit (HOSPITAL_COMMUNITY): Payer: Self-pay

## 2022-12-17 ENCOUNTER — Other Ambulatory Visit: Payer: Self-pay

## 2022-12-19 ENCOUNTER — Other Ambulatory Visit: Payer: Self-pay

## 2022-12-19 ENCOUNTER — Telehealth: Payer: Self-pay

## 2022-12-19 ENCOUNTER — Other Ambulatory Visit (HOSPITAL_COMMUNITY): Payer: Self-pay

## 2022-12-19 NOTE — Telephone Encounter (Signed)
[facility] Patient Advocate Encounter  Prior Authorization for Pifeltro has been approved.    PA# [id number] WH Effective dates: 12/18/22 through 12/18/23  Patients co-pay is $0.00.   [facility] will continue to follow.  Clearance Coots, CPhT Specialty Pharmacy Patient [facility] Phone: [phone] Fax:  [phone]

## 2022-12-24 ENCOUNTER — Other Ambulatory Visit: Payer: Self-pay

## 2022-12-25 ENCOUNTER — Encounter: Payer: Self-pay | Admitting: Infectious Diseases

## 2022-12-25 ENCOUNTER — Other Ambulatory Visit: Payer: Self-pay

## 2022-12-25 ENCOUNTER — Ambulatory Visit (INDEPENDENT_AMBULATORY_CARE_PROVIDER_SITE_OTHER): Payer: 59 | Admitting: Infectious Diseases

## 2022-12-25 ENCOUNTER — Other Ambulatory Visit (HOSPITAL_COMMUNITY): Payer: Self-pay

## 2022-12-25 VITALS — BP 108/70 | HR 73 | Resp 16 | Ht 61.0 in | Wt 120.0 lb

## 2022-12-25 DIAGNOSIS — B20 Human immunodeficiency virus [HIV] disease: Secondary | ICD-10-CM

## 2022-12-25 MED ORDER — DAPSONE 100 MG PO TABS
100.0000 mg | ORAL_TABLET | Freq: Every day | ORAL | 5 refills | Status: AC
  Filled 2022-12-25: qty 30, 30d supply, fill #0
  Filled 2023-01-27: qty 30, 30d supply, fill #1
  Filled 2023-03-11: qty 30, 30d supply, fill #2
  Filled 2023-04-06: qty 30, 30d supply, fill #3
  Filled 2023-05-04: qty 30, 30d supply, fill #4
  Filled 2023-05-28: qty 30, 30d supply, fill #5

## 2022-12-25 MED ORDER — BIKTARVY 50-200-25 MG PO TABS
1.0000 | ORAL_TABLET | Freq: Every day | ORAL | 11 refills | Status: AC
Start: 2022-12-25 — End: ?
  Filled 2022-12-25 – 2023-01-07 (×2): qty 30, 30d supply, fill #0
  Filled 2023-01-27: qty 30, 30d supply, fill #1
  Filled 2023-03-11: qty 30, 30d supply, fill #2
  Filled 2023-04-06: qty 30, 30d supply, fill #3
  Filled 2023-05-04: qty 30, 30d supply, fill #4
  Filled 2023-05-28: qty 30, 30d supply, fill #5

## 2022-12-25 NOTE — Progress Notes (Signed)
Name: Robert Rosario  DOB: 07-28-1970 MRN: [id number] PCP: Lonie Peak, PA-C     Brief Narrative:  Robert Rosario is a 52 y.o. male with HIV, AIDS(+) at entry to care, Dx 04/2019.  CD4 nadir < 35 VL 341,000 HIV Risk: MSM History of OIs: esophageal candidiasis  Intake Labs 04/2019: Hep B sAg (-), sAb (-), cAb (-); Hep A (+), Hep C (-) Quantiferon (-) HLA B*5701 (-) G6PD: ()   Previous Regimens: Biktarvy 05/2019     Genotypes: 04/2019 - K103R, V179D   Subjective:   Chief Complaint  Patient presents with   Follow-up     HPI: Robert Rosario is gone. Appetite is back and he is drinking much less alcohol.  He just received the doravarine yesterday after PA process and started taking it. Needs a refill of the dapsone - he did not receive that one.   No concerns today - overall feels he is improving. Needs dental referral for routine care.   Aside from the above he denies any symptoms of other associated opportunistic infection or advancing HIV disease such as fevers, night sweats, anorexia, cough, SOB, nausea, vomiting, diarrhea, headache, sensory changes, lymphadenopathy.    Review of Systems  Constitutional:  Negative for chills, diaphoresis, fever, malaise/fatigue and weight loss.  Respiratory: Negative.    Cardiovascular: Negative.   Gastrointestinal: Negative.   Genitourinary: Negative.   Musculoskeletal: Negative.   Skin: Negative.      Past Medical History:  Diagnosis Date   Diabetes mellitus without complication (HCC)    HIV (human immunodeficiency virus infection) (HCC) 05/16/2019    Outpatient Medications Prior to Visit  Medication Sig Dispense Refill   aspirin 81 MG chewable tablet Chew by mouth daily.     atorvastatin (LIPITOR) 80 MG tablet Take 80 mg by mouth daily.     glipiZIDE (GLUCOTROL XL) 10 MG 24 hr tablet Take 10 mg by mouth daily.     metFORMIN (GLUCOPHAGE-XR) 500 MG 24 hr tablet Take 500 mg by mouth 2 (two) times daily.      bictegravir-emtricitabine-tenofovir AF (BIKTARVY) 50-200-25 MG TABS tablet Take 1 tablet by mouth daily. 30 tablet 5   dapsone 100 MG tablet Take 1 tablet (100 mg total) by mouth daily. 30 tablet 5   diclofenac (VOLTAREN) 50 MG EC tablet Take 50 mg by mouth 3 (three) times daily. (Patient not taking: Reported on 12/25/2022)     doravirine (PIFELTRO) 100 MG TABS tablet Take 1 tablet (100 mg total) by mouth daily. Take WITH Biktarvy (Patient not taking: Reported on 12/25/2022) 30 tablet 11   hydrALAZINE (APRESOLINE) 25 MG tablet Take 1 tablet (25 mg total) by mouth 3 (three) times daily. (Patient not taking: Reported on 09/27/2020) 60 tablet 1   lisinopril (ZESTRIL) 5 MG tablet Take 5 mg by mouth daily. (Patient not taking: Reported on 12/25/2022)     predniSONE (STERAPRED UNI-PAK 21 TAB) 5 MG (21) TBPK tablet Take by mouth as directed. (Patient not taking: Reported on 12/25/2022)     triamcinolone ointment (KENALOG) 0.5 % Apply 1 application topically 2 (two) times daily. (Patient not taking: Reported on 11/17/2022) 30 g 1   fluconazole (DIFLUCAN) 150 MG tablet Take 1 tablet (150 mg total) by mouth daily. (Patient not taking: Reported on 12/25/2022) 7 tablet 0   No facility-administered medications prior to visit.     Allergies  Allergen Reactions   Sulfa Antibiotics Rash    Social History   Tobacco Use   Smoking status: Never  Smokeless tobacco: Never  Substance Use Topics   Alcohol use: Yes    Alcohol/week: 1.0 standard drink of alcohol    Types: 1 Glasses of wine per week   Drug use: Never    Family History  Problem Relation Age of Onset   Diabetes Mother     Social History   Substance and Sexual Activity  Sexual Activity Yes   Partners: Female     Objective:   Vitals:   12/25/22 0933  BP: 108/70  Pulse: 73  Resp: 16  Weight: 120 lb (54.4 kg)  Height: 5\' 1"  (1.549 m)   Body mass index is 22.67 kg/m.  Physical Exam Constitutional:      Appearance: He is  well-developed.  HENT:     Mouth/Throat:     Dentition: Normal dentition. No dental abscesses.  Cardiovascular:     Rate and Rhythm: Normal rate and regular rhythm.     Heart sounds: Normal heart sounds.  Pulmonary:     Effort: Pulmonary effort is normal.     Breath sounds: Normal breath sounds.  Abdominal:     General: There is no distension.     Palpations: Abdomen is soft.     Tenderness: There is no abdominal tenderness.  Lymphadenopathy:     Cervical: No cervical adenopathy.  Skin:    General: Skin is warm and dry.     Findings: No rash.  Neurological:     Mental Status: He is alert and oriented to person, place, and time.  Psychiatric:        Judgment: Judgment normal.     Lab Results Lab Results  Component Value Date   WBC 4.3 11/17/2022   HGB 14.6 11/17/2022   HCT 42.5 11/17/2022   MCV 96.4 11/17/2022   PLT 246 11/17/2022    Lab Results  Component Value Date   CREATININE 0.81 11/17/2022   BUN 16 11/17/2022   NA 135 11/17/2022   K 4.3 11/17/2022   CL 99 11/17/2022   CO2 29 11/17/2022    Lab Results  Component Value Date   ALT 49 (H) 11/17/2022   AST 42 (H) 11/17/2022   BILITOT 0.5 11/17/2022    Lab Results  Component Value Date   CHOL 258 (H) 06/21/2020   HDL 39 (L) 06/21/2020   LDLCALC 181 (H) 06/21/2020   TRIG 215 (H) 06/21/2020   CHOLHDL 6.6 (H) 06/21/2020   HIV 1 RNA Quant  Date Value  11/17/2022 39,200 copies/mL (H)  09/27/2020 21,600 Copies/mL (H)  06/21/2020 135,000 copies/mL (H)   CD4 T Cell Abs (/uL)  Date Value  11/17/2022 <35 (L)  06/21/2020 64 (L)  07/07/2019 113 (L)     Assessment & Plan:   Problem List Items Addressed This Visit       High   HIV (human immunodeficiency virus infection) (HCC) - Primary (Chronic)    Very well controlled on once daily Biktarvy and Pifeltro taken together. He has new mutations concerning for drug resistance and we needed to add the pifeltro - will repeat VL to ensure still coming down.   No changes to insurance coverage.  No dental needs today - completed referral for routine care.   No concern over anxious/depressed mood.  Sexual health and family planning discussed - no needs today.  Vaccines updated today - see health maintenance section.          Relevant Medications   bictegravir-emtricitabine-tenofovir AF (BIKTARVY) 50-200-25 MG TABS tablet   dapsone 100  MG tablet   Other Relevant Orders   HIV 1 RNA quant-no reflex-bld   AIDS (acquired immune deficiency syndrome) (HCC)    Continue Dapsone for prophylaxis.  Robert Rosario has resolved. No other concerns today and hopefully as viral load comes down he will have good immunologic recovery.       Relevant Medications   bictegravir-emtricitabine-tenofovir AF (BIKTARVY) 50-200-25 MG TABS tablet   dapsone 100 MG tablet     Rexene Alberts, MSN, NP-C [facility] for [facility] Pager: [phone] Office: [phone]  12/25/22  9:58 AM

## 2022-12-25 NOTE — Patient Instructions (Signed)
Please continue your Biktarvy, Pifeltro and Dapsone all once a day.   Will update your labs today and have you back in 3 months to check in again.   If anything comes up before your next appointment that is concerning please come back to see me sooner.

## 2022-12-25 NOTE — Assessment & Plan Note (Signed)
Continue Dapsone for prophylaxis.  Robert Rosario has resolved. No other concerns today and hopefully as viral load comes down he will have good immunologic recovery.

## 2022-12-25 NOTE — Assessment & Plan Note (Signed)
Very well controlled on once daily Biktarvy and Pifeltro taken together. He has new mutations concerning for drug resistance and we needed to add the pifeltro - will repeat VL to ensure still coming down.  No changes to insurance coverage.  No dental needs today - completed referral for routine care.   No concern over anxious/depressed mood.  Sexual health and family planning discussed - no needs today.  Vaccines updated today - see health maintenance section.

## 2022-12-28 LAB — HIV-1 RNA QUANT-NO REFLEX-BLD
HIV 1 RNA Quant: 1110 {copies}/mL — ABNORMAL HIGH
HIV-1 RNA Quant, Log: 3.05 {Log_copies}/mL — ABNORMAL HIGH

## 2022-12-29 ENCOUNTER — Other Ambulatory Visit (HOSPITAL_BASED_OUTPATIENT_CLINIC_OR_DEPARTMENT_OTHER): Payer: Self-pay

## 2022-12-31 ENCOUNTER — Telehealth: Payer: Self-pay

## 2022-12-31 NOTE — Telephone Encounter (Signed)
Detectable Viral Load Intervention (DVL)  Most recent VL:  HIV 1 RNA Quant  Date Value Ref Range Status  12/25/2022 1,110 (H) Copies/mL Final  11/17/2022 39,200 (H) copies/mL Final  09/27/2020 21,600 (H) Copies/mL Final    Last Clinic Visit: 04/07/23  Current ART regimen: Biktarvy/ Pifeltro  Appointment status: patient does not have future appointment scheduled   Medication last dispensed (per chart review):   Dispensed Days Supply Quantity Provider Pharmacy  bictegravir-emtricitabine-tenofovir AF (BIKTARVY) 50-200-25 MG TABS tablet 12/19/2022 30 30 tablet Dixon, Gomez Cleverly, NP [place] [facility]    Dispensed Days Supply Quantity Provider Pharmacy  doravirine (PIFELTRO) 100 MG TABS tablet 12/19/2022 30 30 tablet Dixon, Gomez Cleverly, NP [place] [facility]   Medication Adherence   Not able to assess   Barriers to Care   Not able to assess  Interventions   Called patient to discuss medication adherence and possible barriers to care, not able to reach pt, vm not setup. Juanita Laster, RMA

## 2023-01-01 ENCOUNTER — Other Ambulatory Visit (HOSPITAL_COMMUNITY): Payer: Self-pay

## 2023-01-07 ENCOUNTER — Other Ambulatory Visit: Payer: Self-pay

## 2023-01-07 NOTE — Progress Notes (Signed)
Specialty Pharmacy Refill Coordination Note  Robert Rosario is a 52 y.o. male contacted today regarding refills of specialty medication(s) Bictegravir-Emtricitab-Tenofov   Patient requested Delivery   Delivery date: 01/13/23   Verified address: [address]   [place] [address]   Medication will be filled on 01/12/23.

## 2023-01-08 ENCOUNTER — Other Ambulatory Visit: Payer: Self-pay

## 2023-01-08 ENCOUNTER — Other Ambulatory Visit (HOSPITAL_COMMUNITY): Payer: Self-pay

## 2023-01-12 ENCOUNTER — Other Ambulatory Visit: Payer: Self-pay

## 2023-01-27 ENCOUNTER — Other Ambulatory Visit: Payer: Self-pay

## 2023-01-27 ENCOUNTER — Other Ambulatory Visit (HOSPITAL_COMMUNITY): Payer: Self-pay | Admitting: Pharmacy Technician

## 2023-01-27 ENCOUNTER — Other Ambulatory Visit (HOSPITAL_COMMUNITY): Payer: Self-pay

## 2023-01-27 NOTE — Progress Notes (Signed)
Specialty Pharmacy Refill Coordination Note  Robert Rosario is a 52 y.o. male contacted today regarding refills of specialty medication(s) Bictegravir-Emtricitab-Tenofov; Doravirine   Patient requested Delivery   Delivery date: 02/10/23   Verified address: [address]  [name]   Medication will be filled on 02/09/23.

## 2023-01-27 NOTE — Progress Notes (Signed)
Specialty Pharmacy Ongoing Clinical Assessment Note  Robert Rosario is a 52 y.o. male who is being followed by the specialty pharmacy service for RxSp HIV   Patient's specialty medication(s) reviewed today: Bictegravir-Emtricitab-Tenofov; Doravirine   Missed doses in the last 4 weeks: 0   Patient/Caregiver did not have any additional questions or concerns.   Therapeutic benefit summary: Patient is achieving benefit   Adverse events/side effects summary: No adverse events/side effects   Patient's therapy is appropriate to: Continue    Goals Addressed             This Visit's Progress    Achieve Undetectable HIV Viral Load < 20       Patient is not on track and improving. Patient will maintain adherence and adhere to provider and/or lab appointments. Per provider note patient formerly very well controlled on Biktarvy but concern of mutation led to addition of Pifeltro. Viral load will be monitored.          Follow up:  3 months  Otto Herb Specialty Pharmacist

## 2023-02-09 ENCOUNTER — Other Ambulatory Visit: Payer: Self-pay

## 2023-03-03 ENCOUNTER — Other Ambulatory Visit (HOSPITAL_COMMUNITY): Payer: Self-pay

## 2023-03-11 ENCOUNTER — Other Ambulatory Visit: Payer: Self-pay

## 2023-03-11 NOTE — Progress Notes (Signed)
 Specialty Pharmacy Refill Coordination Note  Robert Rosario is a 53 y.o. male contacted today regarding refills of specialty medication(s) Bictegravir-Emtricitab-Tenofov (Biktarvy ); Doravirine  (PIFELTRO )   Patient requested Delivery   Delivery date: 03/13/23   Verified address: [address]   [place] [address]   Medication will be filled on 03/12/23.

## 2023-03-12 ENCOUNTER — Other Ambulatory Visit (HOSPITAL_COMMUNITY): Payer: Self-pay

## 2023-03-12 ENCOUNTER — Telehealth: Payer: Self-pay

## 2023-03-12 ENCOUNTER — Other Ambulatory Visit: Payer: Self-pay

## 2023-03-12 NOTE — Progress Notes (Signed)
 03/12/23: Dapsone  Medication was previously $0. New copay is $14.63. Called for payment information, but voicemail box has not been set up yet. Will charge to AR account. Patient only has 5 tablets remaining per previous encounter.

## 2023-03-12 NOTE — Telephone Encounter (Signed)
 [facility] Patient Advocate Encounter   Was successful in obtaining a MER[employer] copay card for Pifletro.  This copay card will make the patients copay $0.00.  I have spoken with the patient.    The billing information is as follows and has been shared with [facility].  RxBin:  [id number] PCN: [id number] Member ID: [id number] Group ID: [id number] Expiration Date: 03/02/2024   Arland Hutchinson, CPhT Specialty Pharmacy Patient Advocate [facility] for Infectious Disease Phone: [phone] Fax:  [phone]

## 2023-04-01 ENCOUNTER — Other Ambulatory Visit: Payer: Self-pay

## 2023-04-03 ENCOUNTER — Other Ambulatory Visit: Payer: Self-pay

## 2023-04-06 ENCOUNTER — Other Ambulatory Visit: Payer: Self-pay

## 2023-04-06 ENCOUNTER — Other Ambulatory Visit (HOSPITAL_COMMUNITY): Payer: Self-pay

## 2023-04-06 NOTE — Progress Notes (Signed)
Specialty Pharmacy Refill Coordination Note  Shaunak Kreis is a 53 y.o. male contacted today regarding refills of specialty medication(s) Bictegravir-Emtricitab-Tenofov Musician); Doravirine (PIFELTRO)   Patient requested Delivery   Delivery date: 04/08/23   Verified address: [address]   [place] [address]   Medication will be filled on 04/07/23.

## 2023-04-07 ENCOUNTER — Other Ambulatory Visit: Payer: Self-pay

## 2023-04-07 ENCOUNTER — Ambulatory Visit: Payer: 59 | Admitting: Infectious Diseases

## 2023-04-16 ENCOUNTER — Ambulatory Visit (INDEPENDENT_AMBULATORY_CARE_PROVIDER_SITE_OTHER): Payer: 59 | Admitting: Infectious Diseases

## 2023-04-16 ENCOUNTER — Other Ambulatory Visit: Payer: Self-pay

## 2023-04-16 ENCOUNTER — Encounter: Payer: Self-pay | Admitting: Infectious Diseases

## 2023-04-16 VITALS — BP 111/65 | HR 70 | Temp 98.3°F | Ht 61.0 in | Wt 131.0 lb

## 2023-04-16 DIAGNOSIS — B20 Human immunodeficiency virus [HIV] disease: Secondary | ICD-10-CM | POA: Diagnosis not present

## 2023-04-16 NOTE — Patient Instructions (Addendum)
Please continue your Dapsone once a day.   Please continue your Biktarvy AND Pifeltro together once a day.

## 2023-04-16 NOTE — Progress Notes (Signed)
Name: Robert Rosario  DOB: 09-Jul-1970 MRN: [id number] PCP: Lonie Peak, PA-C     Brief Narrative:  Robert Rosario is a 53 y.o. male with HIV, AIDS(+) at entry to care, Dx 04/2019.  CD4 nadir < 35 VL 341,000 HIV Risk: MSM History of OIs: esophageal candidiasis  Intake Labs 04/2019: Hep B sAg (-), sAb (-), cAb (-); Hep A (+), Hep C (-) Quantiferon (-) HLA B*5701 (-) G6PD: ()   Previous Regimens: Biktarvy 05/2019  Biktarvy + Pifeltro 2024    Genotypes: 04/2019 - K103R, [id number]   Subjective   Subjective:   Chief Complaint  Patient presents with   Follow-up    b20     Discussed the use of AI scribe software for clinical note transcription with the patient, who gave verbal consent to proceed.  History of Present Illness   Robert Rosario is a 53 year old male with HIV, AIDS who presents for follow-up.  He has been feeling better since his last visit in October, with weight gain and no new or worrisome symptoms. He is taking his medications without difficulty and has no issues obtaining them. His viral load has decreased significantly from 39,000 to 1,100 since October. He is currently on Biktarvy and Pifeltro, along with Dapsone for prophylaxis.   The thrush has resolved, which was likely related to his immunocompromised state due to HIV infection. Reports no complaints today suggestive of associated opportunistic infection or advancing HIV disease such as fevers, night sweats, weight loss, anorexia, cough, SOB, nausea, vomiting, diarrhea, headache, sensory changes, lymphadenopathy or oral thrush.   His mood is stable, although he experiences fluctuations in alcohol consumption, describing it as 'up and down.' He continues to work with his brother and is currently working the first shift, which he does not prefer due to the timing. Previously, he worked the second shift, which he liked better.       Review of Systems  Constitutional:  Negative for chills, diaphoresis, fever,  malaise/fatigue and weight loss.  Respiratory: Negative.    Cardiovascular: Negative.   Gastrointestinal: Negative.   Genitourinary: Negative.   Musculoskeletal: Negative.   Skin: Negative.      Past Medical History:  Diagnosis Date   Diabetes mellitus without complication (HCC)    HIV (human immunodeficiency virus infection) (HCC) 05/16/2019    Outpatient Medications Prior to Visit  Medication Sig Dispense Refill   aspirin 81 MG chewable tablet Chew by mouth daily.     atorvastatin (LIPITOR) 80 MG tablet Take 80 mg by mouth daily.     bictegravir-emtricitabine-tenofovir AF (BIKTARVY) 50-200-25 MG TABS tablet Take 1 tablet by mouth daily. 30 tablet 11   dapsone 100 MG tablet Take 1 tablet (100 mg total) by mouth daily. 30 tablet 5   doravirine (PIFELTRO) 100 MG TABS tablet Take 1 tablet (100 mg total) by mouth daily. Take WITH Biktarvy 30 tablet 11   glipiZIDE (GLUCOTROL XL) 10 MG 24 hr tablet Take 10 mg by mouth daily.     metFORMIN (GLUCOPHAGE-XR) 500 MG 24 hr tablet Take 500 mg by mouth 2 (two) times daily.     diclofenac (VOLTAREN) 50 MG EC tablet Take 50 mg by mouth 3 (three) times daily. (Patient not taking: Reported on 04/16/2023)     hydrALAZINE (APRESOLINE) 25 MG tablet Take 1 tablet (25 mg total) by mouth 3 (three) times daily. (Patient not taking: Reported on 04/16/2023) 60 tablet 1   lisinopril (ZESTRIL) 5 MG tablet Take 5 mg by mouth  daily. (Patient not taking: Reported on 04/16/2023)     triamcinolone ointment (KENALOG) 0.5 % Apply 1 application topically 2 (two) times daily. (Patient not taking: Reported on 04/16/2023) 30 g 1   predniSONE (STERAPRED UNI-PAK 21 TAB) 5 MG (21) TBPK tablet Take by mouth as directed. (Patient not taking: Reported on 04/16/2023)     No facility-administered medications prior to visit.     Allergies  Allergen Reactions   Sulfa Antibiotics Rash    Social History   Tobacco Use   Smoking status: Never   Smokeless tobacco: Never  Substance  Use Topics   Alcohol use: Yes    Alcohol/week: 1.0 standard drink of alcohol    Types: 1 Glasses of wine per week   Drug use: Never    Family History  Problem Relation Age of Onset   Diabetes Mother     Social History   Substance and Sexual Activity  Sexual Activity Yes   Partners: Female      Objective   Objective:   Vitals:   04/16/23 0940  BP: 111/65  Pulse: 70  Temp: 98.3 F (36.8 C)  TempSrc: Oral  SpO2: 98%  Weight: 131 lb (59.4 kg)  Height: 5\' 1"  (1.549 m)   Body mass index is 24.75 kg/m.  Physical Exam Constitutional:      Appearance: He is well-developed.  HENT:     Mouth/Throat:     Dentition: Normal dentition. No dental abscesses.  Cardiovascular:     Rate and Rhythm: Normal rate and regular rhythm.     Heart sounds: Normal heart sounds.  Pulmonary:     Effort: Pulmonary effort is normal.     Breath sounds: Normal breath sounds.  Abdominal:     General: There is no distension.     Palpations: Abdomen is soft.     Tenderness: There is no abdominal tenderness.  Lymphadenopathy:     Cervical: No cervical adenopathy.  Skin:    General: Skin is warm and dry.     Findings: No rash.  Neurological:     Mental Status: He is alert and oriented to person, place, and time.  Psychiatric:        Judgment: Judgment normal.     Lab Results Lab Results  Component Value Date   WBC 4.3 11/17/2022   HGB 14.6 11/17/2022   HCT 42.5 11/17/2022   MCV 96.4 11/17/2022   PLT 246 11/17/2022    Lab Results  Component Value Date   CREATININE 0.81 11/17/2022   BUN 16 11/17/2022   NA 135 11/17/2022   K 4.3 11/17/2022   CL 99 11/17/2022   CO2 29 11/17/2022    Lab Results  Component Value Date   ALT 49 (H) 11/17/2022   AST 42 (H) 11/17/2022   BILITOT 0.5 11/17/2022    Lab Results  Component Value Date   CHOL 258 (H) 06/21/2020   HDL 39 (L) 06/21/2020   LDLCALC 181 (H) 06/21/2020   TRIG 215 (H) 06/21/2020   CHOLHDL 6.6 (H) 06/21/2020   HIV 1  RNA Quant  Date Value  12/25/2022 1,110 Copies/mL (H)  11/17/2022 39,200 copies/mL (H)  09/27/2020 21,600 Copies/mL (H)   CD4 T Cell Abs (/uL)  Date Value  11/17/2022 <35 (L)  06/21/2020 64 (L)  07/07/2019 113 (L)       Assessment & Plan:      HIV, AIDS CD4 < 35 -  Viral load decreasing (from 39,000 to 1,100) on current regimen  of Biktarvy and Pifeltro. Patient declined switch to injectable Cabenuva. Clinically showing signs of improvement with consistent medication adherence.  -Continue Biktarvy and Pifeltro. -Check viral load and CD4 count today. -Continue Dapsone for prophylaxis once daily.   Alcohol use - Patient reports fluctuating alcohol consumption. -Encouraged patient to reach out if needing more support with alcohol use.  Follow-up in 3 months to monitor immune system recovery and medication adherence.       No orders of the defined types were placed in this encounter.  Orders Placed This Encounter  Procedures   HIV 1 RNA quant-no reflex-bld   T-helper cells (CD4) count   Return in about 3 months (around 07/14/2023).    Rexene Alberts, MSN, NP-C [facility] for Infectious Disease [facility] Pager: [phone] Office: [phone]  04/16/23  10:35 AM

## 2023-04-17 LAB — T-HELPER CELLS (CD4) COUNT (NOT AT ARMC)
CD4 % Helper T Cell: 7 % — ABNORMAL LOW (ref 33–65)
CD4 T Cell Abs: 72 /uL — ABNORMAL LOW (ref 400–1790)

## 2023-04-19 LAB — HIV-1 RNA QUANT-NO REFLEX-BLD
HIV 1 RNA Quant: 50 {copies}/mL — ABNORMAL HIGH
HIV-1 RNA Quant, Log: 1.7 {Log} — ABNORMAL HIGH

## 2023-04-22 ENCOUNTER — Other Ambulatory Visit: Payer: Self-pay

## 2023-04-23 ENCOUNTER — Telehealth: Payer: Self-pay

## 2023-04-23 NOTE — Telephone Encounter (Signed)
-----   Message from [place] sent at 04/22/2023 12:24 PM EST ----- Please call [facility] to let Robert Rosario know that his viral load has dropped to undetectable at only 50 copies. His CD4 has also shown some initial signs of recovery - still only 74 cells.No changes to his plan  - please continue the biktarvy + Pifeltro + dapsone taken everyday.

## 2023-04-23 NOTE — Telephone Encounter (Signed)
 Called patient, no answer and no voicemail set up.   Sandie Ano, RN

## 2023-04-24 ENCOUNTER — Other Ambulatory Visit: Payer: Self-pay

## 2023-04-24 NOTE — Telephone Encounter (Signed)
Second attempt to reach patient, no answer and unable to leave message.  Sandie Ano, RN

## 2023-04-28 ENCOUNTER — Other Ambulatory Visit (HOSPITAL_COMMUNITY): Payer: Self-pay

## 2023-04-28 ENCOUNTER — Encounter (HOSPITAL_COMMUNITY): Payer: Self-pay

## 2023-04-28 NOTE — Telephone Encounter (Signed)
 Attempted to contact patient - no voicemail set up to leave a message.

## 2023-04-29 ENCOUNTER — Other Ambulatory Visit: Payer: Self-pay | Admitting: Pharmacist

## 2023-04-29 NOTE — Progress Notes (Signed)
 Specialty Pharmacy Ongoing Clinical Assessment Note  Robert Rosario is a 53 y.o. male who is being followed by the specialty pharmacy service for RxSp HIV   Patient's specialty medication(s) reviewed today: Bictegravir-Emtricitab-Tenofov (Biktarvy); Doravirine (PIFELTRO)   Missed doses in the last 4 weeks: 2   Patient/Caregiver did not have any additional questions or concerns.   Therapeutic benefit summary: Patient is achieving benefit   Adverse events/side effects summary: No adverse events/side effects   Patient's therapy is appropriate to: Continue    Goals Addressed             This Visit's Progress    Comply with lab assessments       Patient is on track. Patient will adhere to provider and/or lab appointments.      Maintain optimal adherence to therapy       Patient is on track. Patient will work on increased adherence.         Follow up:  3 months  [name] L. Jannette Fogo, PharmD, BCIDP, AAHIVP, CPP Clinical Pharmacist Practitioner Infectious Diseases Clinical Pharmacist [facility] 04/29/2023, 11:05 AM

## 2023-04-30 NOTE — Telephone Encounter (Signed)
 Fourth attempt to reach [facility], no answer and unable to leave message.   Sandie Ano, RN

## 2023-05-04 ENCOUNTER — Other Ambulatory Visit (HOSPITAL_COMMUNITY): Payer: Self-pay

## 2023-05-04 ENCOUNTER — Other Ambulatory Visit: Payer: Self-pay

## 2023-05-04 NOTE — Progress Notes (Signed)
 Specialty Pharmacy Refill Coordination Note  Robert Rosario is a 53 y.o. male contacted today regarding refills of specialty medication(s) Bictegravir-Emtricitab-Tenofov Musician); Doravirine (PIFELTRO)   Patient requested Delivery   Delivery date: 05/06/23   Verified address: [address]   [place] [address]   Medication will be filled on 05/05/23.

## 2023-05-21 ENCOUNTER — Other Ambulatory Visit: Payer: Self-pay

## 2023-05-25 ENCOUNTER — Other Ambulatory Visit: Payer: Self-pay

## 2023-05-27 ENCOUNTER — Other Ambulatory Visit: Payer: Self-pay

## 2023-05-28 ENCOUNTER — Other Ambulatory Visit: Payer: Self-pay

## 2023-05-28 NOTE — Progress Notes (Signed)
 Specialty Pharmacy Refill Coordination Note  Robert Rosario is a 53 y.o. male contacted today regarding refills of specialty medication(s) Bictegravir-Emtricitab-Tenofov [name])   Patient requested Delivery   Delivery date: 06/08/23   Verified address: [address]   [place] [address]   Medication will be filled on 06/05/23.

## 2023-06-05 ENCOUNTER — Other Ambulatory Visit: Payer: Self-pay

## 2023-07-03 ENCOUNTER — Other Ambulatory Visit: Payer: Self-pay

## 2023-07-06 ENCOUNTER — Ambulatory Visit: Payer: 59 | Admitting: Infectious Diseases

## 2023-07-14 NOTE — Progress Notes (Signed)
 The ASCVD Risk score (Arnett DK, et al., 2019) failed to calculate for the following reasons:   Cannot find a previous HDL lab   Cannot find a previous total cholesterol lab  Arlon Bergamo, BSN, RN

## 2023-07-15 ENCOUNTER — Ambulatory Visit: Admitting: Infectious Diseases

## 2023-07-28 ENCOUNTER — Other Ambulatory Visit: Payer: Self-pay

## 2023-12-02 ENCOUNTER — Telehealth: Payer: Self-pay

## 2023-12-02 ENCOUNTER — Other Ambulatory Visit: Payer: Self-pay

## 2023-12-02 NOTE — Progress Notes (Addendum)
 Patient is being dis enrolled due to Non compliance. We have tried contacting the patient since 07/02/23. Patient will be re enrolled once care is reestablished.

## 2023-12-02 NOTE — Telephone Encounter (Signed)
 error
